# Patient Record
Sex: Female | Born: 1960 | Race: White | Hispanic: No | Marital: Married | Smoking: Never smoker
Health system: Southern US, Community
[De-identification: ages and names within clinical notes are randomized; demographics above are authoritative.]

## PROBLEM LIST (undated history)

## (undated) DIAGNOSIS — I2510 Atherosclerotic heart disease of native coronary artery without angina pectoris: Secondary | ICD-10-CM

## (undated) DIAGNOSIS — H9011 Conductive hearing loss, unilateral, right ear, with unrestricted hearing on the contralateral side: Secondary | ICD-10-CM

## (undated) DIAGNOSIS — K812 Acute cholecystitis with chronic cholecystitis: Secondary | ICD-10-CM

## (undated) DIAGNOSIS — R51 Headache: Secondary | ICD-10-CM

## (undated) DIAGNOSIS — H7201 Central perforation of tympanic membrane, right ear: Secondary | ICD-10-CM

## (undated) DIAGNOSIS — N952 Postmenopausal atrophic vaginitis: Secondary | ICD-10-CM

## (undated) DIAGNOSIS — I491 Atrial premature depolarization: Secondary | ICD-10-CM

## (undated) DIAGNOSIS — I493 Ventricular premature depolarization: Secondary | ICD-10-CM

## (undated) DIAGNOSIS — M79602 Pain in left arm: Secondary | ICD-10-CM

## (undated) DIAGNOSIS — K2960 Other gastritis without bleeding: Secondary | ICD-10-CM

## (undated) DIAGNOSIS — M542 Cervicalgia: Secondary | ICD-10-CM

## (undated) DIAGNOSIS — I499 Cardiac arrhythmia, unspecified: Secondary | ICD-10-CM

## (undated) DIAGNOSIS — J329 Chronic sinusitis, unspecified: Secondary | ICD-10-CM

## (undated) DIAGNOSIS — R918 Other nonspecific abnormal finding of lung field: Secondary | ICD-10-CM

## (undated) DIAGNOSIS — R197 Diarrhea, unspecified: Secondary | ICD-10-CM

## (undated) DIAGNOSIS — J4530 Mild persistent asthma, uncomplicated: Secondary | ICD-10-CM

## (undated) DIAGNOSIS — K811 Chronic cholecystitis: Secondary | ICD-10-CM

## (undated) DIAGNOSIS — K828 Other specified diseases of gallbladder: Secondary | ICD-10-CM

## (undated) DIAGNOSIS — H9042 Sensorineural hearing loss, unilateral, left ear, with unrestricted hearing on the contralateral side: Secondary | ICD-10-CM

## (undated) DIAGNOSIS — Z8619 Personal history of other infectious and parasitic diseases: Secondary | ICD-10-CM

## (undated) DIAGNOSIS — J4540 Moderate persistent asthma, uncomplicated: Secondary | ICD-10-CM

## (undated) DIAGNOSIS — M21619 Bunion of unspecified foot: Secondary | ICD-10-CM

## (undated) DIAGNOSIS — M47817 Spondylosis without myelopathy or radiculopathy, lumbosacral region: Secondary | ICD-10-CM

## (undated) DIAGNOSIS — T4145XA Adverse effect of unspecified anesthetic, initial encounter: Secondary | ICD-10-CM

## (undated) DIAGNOSIS — T8859XA Other complications of anesthesia, initial encounter: Secondary | ICD-10-CM

## (undated) DIAGNOSIS — E7800 Pure hypercholesterolemia, unspecified: Secondary | ICD-10-CM

## (undated) DIAGNOSIS — E785 Hyperlipidemia, unspecified: Secondary | ICD-10-CM

## (undated) DIAGNOSIS — M79609 Pain in unspecified limb: Secondary | ICD-10-CM

## (undated) DIAGNOSIS — L659 Nonscarring hair loss, unspecified: Secondary | ICD-10-CM

## (undated) HISTORY — DX: Pain in unspecified limb: M79.609

## (undated) HISTORY — DX: Chronic sinusitis, unspecified: J32.9

## (undated) HISTORY — DX: Cardiac arrhythmia, unspecified: I49.9

## (undated) HISTORY — DX: Pain in left arm: M79.602

## (undated) HISTORY — DX: Ventricular premature depolarization: I49.3

## (undated) HISTORY — DX: Other specified diseases of gallbladder: K82.8

## (undated) HISTORY — DX: Mild persistent asthma, uncomplicated: J45.30

## (undated) HISTORY — DX: Atrial premature depolarization: I49.1

## (undated) HISTORY — DX: Personal history of other infectious and parasitic diseases: Z86.19

## (undated) HISTORY — DX: Conductive hearing loss, unilateral, right ear, with unrestricted hearing on the contralateral side: H90.11

## (undated) HISTORY — DX: Bunion of unspecified foot: M21.619

## (undated) HISTORY — DX: Nonscarring hair loss, unspecified: L65.9

## (undated) HISTORY — DX: Other nonspecific abnormal finding of lung field: R91.8

## (undated) HISTORY — DX: Chronic cholecystitis: K81.1

## (undated) HISTORY — DX: Spondylosis without myelopathy or radiculopathy, lumbosacral region: M47.817

## (undated) HISTORY — PX: CHOLECYSTECTOMY: SHX55

## (undated) HISTORY — PX: INNER EAR SURGERY: SHX679

## (undated) HISTORY — DX: Other gastritis without bleeding: K29.60

## (undated) HISTORY — DX: Postmenopausal atrophic vaginitis: N95.2

## (undated) HISTORY — DX: Headache: R51

## (undated) HISTORY — DX: Hyperlipidemia, unspecified: E78.5

## (undated) HISTORY — DX: Cervicalgia: M54.2

## (undated) HISTORY — DX: Moderate persistent asthma, uncomplicated: J45.40

## (undated) HISTORY — DX: Central perforation of tympanic membrane, right ear: H72.01

## (undated) HISTORY — DX: Atherosclerotic heart disease of native coronary artery without angina pectoris: I25.10

## (undated) HISTORY — DX: Pure hypercholesterolemia, unspecified: E78.00

## (undated) HISTORY — DX: Diarrhea, unspecified: R19.7

## (undated) HISTORY — DX: Sensorineural hearing loss, unilateral, left ear, with unrestricted hearing on the contralateral side: H90.42

## (undated) HISTORY — PX: TUBAL LIGATION: SHX77

## (undated) HISTORY — DX: Acute cholecystitis with chronic cholecystitis: K81.2

---

## 1995-05-30 HISTORY — PX: SHOULDER ARTHROSCOPY: SHX128

## 1998-07-01 ENCOUNTER — Other Ambulatory Visit: Admission: RE | Admit: 1998-07-01 | Discharge: 1998-07-01 | Payer: Self-pay | Admitting: Obstetrics and Gynecology

## 2001-09-25 ENCOUNTER — Other Ambulatory Visit: Admission: RE | Admit: 2001-09-25 | Discharge: 2001-09-25 | Payer: Self-pay | Admitting: Obstetrics and Gynecology

## 2003-06-08 ENCOUNTER — Other Ambulatory Visit: Admission: RE | Admit: 2003-06-08 | Discharge: 2003-06-08 | Payer: Self-pay | Admitting: Obstetrics and Gynecology

## 2003-12-07 ENCOUNTER — Other Ambulatory Visit: Admission: RE | Admit: 2003-12-07 | Discharge: 2003-12-07 | Payer: Self-pay | Admitting: Obstetrics and Gynecology

## 2004-12-05 ENCOUNTER — Other Ambulatory Visit: Admission: RE | Admit: 2004-12-05 | Discharge: 2004-12-05 | Payer: Self-pay | Admitting: Obstetrics and Gynecology

## 2008-07-12 ENCOUNTER — Encounter: Admission: RE | Admit: 2008-07-12 | Discharge: 2008-07-12 | Payer: Self-pay | Admitting: Family Medicine

## 2009-08-06 ENCOUNTER — Encounter: Admission: RE | Admit: 2009-08-06 | Discharge: 2009-08-06 | Payer: Self-pay | Admitting: Family Medicine

## 2009-08-18 ENCOUNTER — Encounter: Admission: RE | Admit: 2009-08-18 | Discharge: 2009-08-18 | Payer: Self-pay | Admitting: Pediatrics

## 2011-01-28 HISTORY — PX: WRIST ARTHROPLASTY: SHX1088

## 2011-04-27 ENCOUNTER — Other Ambulatory Visit: Payer: Self-pay | Admitting: Orthopedic Surgery

## 2011-05-02 ENCOUNTER — Encounter (HOSPITAL_BASED_OUTPATIENT_CLINIC_OR_DEPARTMENT_OTHER): Payer: Self-pay | Admitting: *Deleted

## 2011-05-02 NOTE — Progress Notes (Signed)
Not htn,asthma controlled No labs needed

## 2011-05-04 ENCOUNTER — Encounter (HOSPITAL_BASED_OUTPATIENT_CLINIC_OR_DEPARTMENT_OTHER): Payer: Self-pay | Admitting: Orthopedic Surgery

## 2011-05-04 ENCOUNTER — Ambulatory Visit (HOSPITAL_BASED_OUTPATIENT_CLINIC_OR_DEPARTMENT_OTHER)
Admission: RE | Admit: 2011-05-04 | Discharge: 2011-05-04 | Disposition: A | Discharge status: Home / Self Care | Payer: BC Managed Care – PPO | Source: Ambulatory Visit | Attending: Orthopedic Surgery | Admitting: Orthopedic Surgery

## 2011-05-04 ENCOUNTER — Encounter (HOSPITAL_BASED_OUTPATIENT_CLINIC_OR_DEPARTMENT_OTHER)
Admission: RE | Disposition: A | Discharge status: Home / Self Care | Payer: Self-pay | Source: Ambulatory Visit | Attending: Orthopedic Surgery

## 2011-05-04 ENCOUNTER — Encounter (HOSPITAL_BASED_OUTPATIENT_CLINIC_OR_DEPARTMENT_OTHER): Payer: Self-pay | Admitting: Anesthesiology

## 2011-05-04 ENCOUNTER — Ambulatory Visit (HOSPITAL_BASED_OUTPATIENT_CLINIC_OR_DEPARTMENT_OTHER): Payer: BC Managed Care – PPO | Admitting: Anesthesiology

## 2011-05-04 DIAGNOSIS — Z472 Encounter for removal of internal fixation device: Secondary | ICD-10-CM | POA: Insufficient documentation

## 2011-05-04 HISTORY — DX: Other complications of anesthesia, initial encounter: T88.59XA

## 2011-05-04 HISTORY — PX: HARDWARE REMOVAL: SHX979

## 2011-05-04 HISTORY — DX: Adverse effect of unspecified anesthetic, initial encounter: T41.45XA

## 2011-05-04 SURGERY — REMOVAL, HARDWARE
Anesthesia: General | Site: Wrist | Laterality: Left | Wound class: Clean

## 2011-05-04 MED ORDER — ONDANSETRON HCL 4 MG/2ML IJ SOLN
INTRAMUSCULAR | Status: DC | PRN
  Administered 2011-05-04: 4 mg via INTRAVENOUS

## 2011-05-04 MED ORDER — FENTANYL CITRATE 0.05 MG/ML IJ SOLN
INTRAMUSCULAR | Status: DC | PRN
  Administered 2011-05-04: 100 ug via INTRAVENOUS

## 2011-05-04 MED ORDER — IBUPROFEN 600 MG PO TABS: 600.0000 mg | ORAL_TABLET | Freq: Four times a day (QID) | ORAL | Status: AC | PRN

## 2011-05-04 MED ORDER — PROMETHAZINE HCL 25 MG/ML IJ SOLN: 6.2500 mg | INTRAMUSCULAR | Status: DC | PRN

## 2011-05-04 MED ORDER — MEPERIDINE HCL 25 MG/ML IJ SOLN: 6.2500 mg | INTRAMUSCULAR | Status: DC | PRN

## 2011-05-04 MED ORDER — HYDROMORPHONE HCL PF 1 MG/ML IJ SOLN: 0.2500 mg | INTRAMUSCULAR | Status: DC | PRN

## 2011-05-04 MED ORDER — LIDOCAINE HCL 2 % IJ SOLN
INTRAMUSCULAR | Status: DC | PRN
  Administered 2011-05-04: 5 mL

## 2011-05-04 MED ORDER — PROPOFOL 10 MG/ML IV EMUL
INTRAVENOUS | Status: DC | PRN
  Administered 2011-05-04: 150 mg via INTRAVENOUS

## 2011-05-04 MED ORDER — CEPHALEXIN 500 MG PO CAPS: 500.0000 mg | ORAL_CAPSULE | Freq: Three times a day (TID) | ORAL | Status: AC

## 2011-05-04 MED ORDER — MIDAZOLAM HCL 5 MG/5ML IJ SOLN
INTRAMUSCULAR | Status: DC | PRN
  Administered 2011-05-04: 2 mg via INTRAVENOUS

## 2011-05-04 MED ORDER — FENTANYL CITRATE 0.05 MG/ML IJ SOLN
25.0000 ug | INTRAMUSCULAR | Status: DC | PRN
  Administered 2011-05-04: 50 ug via INTRAVENOUS

## 2011-05-04 MED ORDER — HYDROMORPHONE HCL 2 MG PO TABS: ORAL_TABLET | ORAL | Status: AC

## 2011-05-04 MED ORDER — CEFAZOLIN SODIUM 1-5 GM-% IV SOLN
1.0000 g | Freq: Once | INTRAVENOUS | Status: AC
  Administered 2011-05-04: 1 g via INTRAVENOUS

## 2011-05-04 MED ORDER — LACTATED RINGERS IV SOLN
INTRAVENOUS | Status: DC
  Administered 2011-05-04: 09:00:00 via INTRAVENOUS

## 2011-05-04 MED ORDER — CHLORHEXIDINE GLUCONATE 4 % EX LIQD: 60.0000 mL | Freq: Once | CUTANEOUS | Status: DC

## 2011-05-04 MED ORDER — DEXAMETHASONE SODIUM PHOSPHATE 10 MG/ML IJ SOLN
INTRAMUSCULAR | Status: DC | PRN
  Administered 2011-05-04: 10 mg via INTRAVENOUS

## 2011-05-04 SURGICAL SUPPLY — 50 items
BANDAGE ADHESIVE 1X3 (GAUZE/BANDAGES/DRESSINGS) IMPLANT
BANDAGE ELASTIC 3 VELCRO ST LF (GAUZE/BANDAGES/DRESSINGS) ×2 IMPLANT
BANDAGE GAUZE ELAST BULKY 4 IN (GAUZE/BANDAGES/DRESSINGS) ×1 IMPLANT
BLADE MINI RND TIP GREEN BEAV (BLADE) IMPLANT
BLADE SURG 15 STRL LF DISP TIS (BLADE) ×1 IMPLANT
BLADE SURG 15 STRL SS (BLADE) ×2
BNDG CMPR 9X4 STRL LF SNTH (GAUZE/BANDAGES/DRESSINGS) ×1
BNDG ESMARK 4X9 LF (GAUZE/BANDAGES/DRESSINGS) ×2 IMPLANT
BRUSH SCRUB EZ PLAIN DRY (MISCELLANEOUS) ×2 IMPLANT
CLOTH BEACON ORANGE TIMEOUT ST (SAFETY) ×2 IMPLANT
CORDS BIPOLAR (ELECTRODE) ×2 IMPLANT
COVER MAYO STAND STRL (DRAPES) ×2 IMPLANT
COVER TABLE BACK 60X90 (DRAPES) ×2 IMPLANT
CUFF TOURNIQUET SINGLE 18IN (TOURNIQUET CUFF) ×1 IMPLANT
DECANTER SPIKE VIAL GLASS SM (MISCELLANEOUS) ×1 IMPLANT
DRAPE EXTREMITY T 121X128X90 (DRAPE) ×2 IMPLANT
DRAPE OEC MINIVIEW 54X84 (DRAPES) IMPLANT
DRAPE SURG 17X23 STRL (DRAPES) ×2 IMPLANT
GAUZE XEROFORM 1X8 LF (GAUZE/BANDAGES/DRESSINGS) IMPLANT
GLOVE BIO SURGEON STRL SZ 6.5 (GLOVE) ×1 IMPLANT
GLOVE BIOGEL M STRL SZ7.5 (GLOVE) ×2 IMPLANT
GLOVE BIOGEL PI IND STRL 7.0 (GLOVE) IMPLANT
GLOVE BIOGEL PI INDICATOR 7.0 (GLOVE) ×1
GLOVE ORTHO TXT STRL SZ7.5 (GLOVE) ×2 IMPLANT
GOWN BRE IMP PREV XXLGXLNG (GOWN DISPOSABLE) ×2 IMPLANT
GOWN PREVENTION PLUS XLARGE (GOWN DISPOSABLE) ×2 IMPLANT
GOWN PREVENTION PLUS XXLARGE (GOWN DISPOSABLE) ×2 IMPLANT
NEEDLE 27GAX1X1/2 (NEEDLE) ×1 IMPLANT
NS IRRIG 1000ML POUR BTL (IV SOLUTION) ×2 IMPLANT
PACK BASIN DAY SURGERY FS (CUSTOM PROCEDURE TRAY) ×2 IMPLANT
PAD CAST 3X4 CTTN HI CHSV (CAST SUPPLIES) IMPLANT
PADDING CAST ABS 4INX4YD NS (CAST SUPPLIES)
PADDING CAST ABS COTTON 4X4 ST (CAST SUPPLIES) ×1 IMPLANT
PADDING CAST COTTON 3X4 STRL (CAST SUPPLIES) ×2
SLEEVE SCD COMPRESS KNEE MED (MISCELLANEOUS) ×1 IMPLANT
SPLINT PLASTER CAST XFAST 3X15 (CAST SUPPLIES) IMPLANT
SPLINT PLASTER XTRA FASTSET 3X (CAST SUPPLIES) ×5
SPONGE GAUZE 4X4 12PLY (GAUZE/BANDAGES/DRESSINGS) ×1 IMPLANT
STOCKINETTE 4X48 STRL (DRAPES) ×2 IMPLANT
STRIP CLOSURE SKIN 1/2X4 (GAUZE/BANDAGES/DRESSINGS) ×2 IMPLANT
SUT PROLENE 3 0 PS 2 (SUTURE) ×2 IMPLANT
SUT VIC AB 3-0 PS1 18 (SUTURE) ×2
SUT VIC AB 3-0 PS1 18XBRD (SUTURE) ×1 IMPLANT
SYR 3ML 23GX1 SAFETY (SYRINGE) IMPLANT
SYR BULB 3OZ (MISCELLANEOUS) ×2 IMPLANT
SYR CONTROL 10ML LL (SYRINGE) ×1 IMPLANT
TOWEL OR 17X24 6PK STRL BLUE (TOWEL DISPOSABLE) ×2 IMPLANT
TRAY DSU PREP LF (CUSTOM PROCEDURE TRAY) ×2 IMPLANT
UNDERPAD 30X30 INCONTINENT (UNDERPADS AND DIAPERS) ×2 IMPLANT
WATER STERILE IRR 1000ML POUR (IV SOLUTION) ×2 IMPLANT

## 2011-05-04 NOTE — Interval H&P Note (Signed)
History and Physical Interval Note:  05/04/2011 8:30 AM  Jessica Francis  has presented today for surgery, with the diagnosis of status post orif left radius and ulna fractures  The various methods of treatment have been discussed with the patient and family. After consideration of risks, benefits and other options for treatment, the patient has consented to  Procedure(s): HARDWARE REMOVAL as a surgical intervention .  The patients' history has been reviewed, patient examined, no change in status, stable for surgery.  I have reviewed the patients' chart and labs.  Questions were answered to the patient's satisfaction.     Jessica Francis,Jessica Francis  H&P documentation:05/04/11  -History and Physical Reviewed  -Patient has been re-examined  -No change in the plan of care  Jessica Forster, MD

## 2011-05-04 NOTE — Anesthesia Preprocedure Evaluation (Signed)
Anesthesia Evaluation  Patient identified by MRN, date of birth, ID band Patient awake    Reviewed: Allergy & Precautions, H&P , NPO status , Patient's Chart, lab work & pertinent test results  Airway Mallampati: I TM Distance: >3 FB Neck ROM: full    Dental No notable dental hx. (+) Teeth Intact   Pulmonary asthma ,  clear to auscultation  Pulmonary exam normal       Cardiovascular neg cardio ROS regular Normal    Neuro/Psych Negative Neurological ROS  Negative Psych ROS   GI/Hepatic negative GI ROS, Neg liver ROS,   Endo/Other  Negative Endocrine ROS  Renal/GU negative Renal ROS  Genitourinary negative   Musculoskeletal   Abdominal   Peds  Hematology negative hematology ROS (+)   Anesthesia Other Findings   Reproductive/Obstetrics negative OB ROS                           Anesthesia Physical Anesthesia Plan  ASA: II  Anesthesia Plan: General   Post-op Pain Management:    Induction: Intravenous  Airway Management Planned: LMA  Additional Equipment:   Intra-op Plan:   Post-operative Plan: Extubation in OR  Informed Consent: I have reviewed the patients History and Physical, chart, labs and discussed the procedure including the risks, benefits and alternatives for the proposed anesthesia with the patient or authorized representative who has indicated his/her understanding and acceptance.     Plan Discussed with: CRNA  Anesthesia Plan Comments:         Anesthesia Quick Evaluation

## 2011-05-04 NOTE — H&P (Signed)
PATIENT: Jessica Francis      SEEN BY:   Katy Fitch. Naaman Plummer., M.D.    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 On 02-05-11 she fell sustaining a very displaced fracture of her distal radius and ulna on the left. This was an open fracture. She was seen by Dr. Cathrine Muster of [facility] and underwent ORIF with I&D of her fractures on 02-05-11. Dr. Cathrine Muster placed a DVR type plate volar with threaded pegs and screws and used a K-wire to reduce the ulna. The reduction of the ulna was near anatomic, the radius was also near anatomic. She has gone on to heal her wounds without complications. She is in a fiberglass cast that is currently tight. She has done an excellent job of maintaining ROM.   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 Her pin is beginning to show osteolysis in the ulna. She has pegs that are proud dorsally and the contour of her volar plate is above the distal lip of the radius placing her flexor tendons at some risk. She appears to be clinically united to the point where immobilization can be discontinued at this time. I provided detailed informed consent regarding her plate. I have recommended that she consider plate removal at 3 months post injury to prevent extensor or flexor tendon ruptures. At least 2 pegs are transcortical dorsally.   After alcohol prep, (we did not use iodine or Betadine due to her allergy) her pin was removed from the ulna without significant difficulty. She reported quite a bit of pain radiating proximally raising the question whether or not the pin was contacting her dorsal ulnar sensory branch. She was dressed with a Band-aid. She was placed in a wrist forearm Velcro splint. We will encourage warm water soaks and AROM exercises. I will see her back for follow up in 2 weeks at which time we will  begin a structured therapy program. We will tentatively plan on removing her pin from the wrist in December of 2012. She understands she is at some risk for rupture of her tendons as a consequence of hardware prominence.  RVS/phe T: 03-17-11  OFFICE VISIT:      11.2.12             SEEN BY:   Katy Fitch. Naaman Plummer., M.D.    Jessica Francis returns to review the progress of her left distal radius and ulna fractures.  Her pins were removed from the ulna on 03/15/11.  She is working on range of motion.  She can supinate 40 degrees and pronate 30 degrees. She has palmar flexion of 30, dorsiflexion to neutral.  She perceives pain at the distal radial ulnar joint.    Current x-rays reveal healing of her fracture.   PLAN:  We will discontinue her splint. Due to the technical issues with her plate placement and screw length I am recommending that we remove her plate and screws in December. 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  I have encouraged her to begin warm water soaks and aggressive pronation/supination exercises as well as finger and thumb range of motion exercises.  I will see her back for follow-up in two weeks.  At that time we will schedule removal of her plate, screws and possible partial capsulectomy of  the distal radial ulnar joint to facilitate range of motion.   Questions were invited and answered in detail.   Katy Fitch Sypher, M.D., Jr./cmf   T:  11.5.12   PATIENT:  Jessica Francis      SEEN BY:   Katy Fitch. Naaman Plummer., M.D.  OFFICE VISIT:      11.16.12                   DOB:   [birth year]  Jessica Francis returns for follow-up examination of her left distal forearm double bone forearm fracture with intraarticular fractures of the ulnar head, sigmoid notch and distal radius.  She has a malposition DVR plate that is prominent volarly and has long pegs and screws placing her extensors at risk.  She has pain at the distal radial ulnar joint due to a stepoff and sigmoid notch  articulation. Her radius and ulna have healed.  Her stepoff and osteophyte issues are well documented by x-rays today.    We have advised her to schedule plate removal to prevent injury to her extensor tendons and will perform an arthrotomy at the distal radial ulnar joint and cheilectomy to try to correct her crepitation of the distal radial ulnar joint.  The procedure and aftercare were described in detail.  We will schedule this at a mutually convenient time in December.  Her first choice is December 6th.  Katy Fitch Sypher, M.D., Jr./cmf   T:  11.19.12

## 2011-05-04 NOTE — H&P (Addendum)
is an 50 y.o. female.   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 On 02-05-11 she fell sustaining a very displaced fracture of her distal radius and ulna on the left. This was an open fracture. She was seen by Dr. Cathrine Muster of [facility] and underwent ORIF with I&D of her fractures on 02-05-11. Dr. Cathrine Muster placed a DVR type plate volar with threaded pegs and screws and used a K-wire to reduce the ulna. The reduction of the ulna was near anatomic, the radius was also near anatomic. She has gone on to heal her wounds without complications  Past Medical History  Diagnosis Date  . Complication of anesthesia     hard to wake up  . Asthma     Past Surgical History  Procedure Date  . Tubal ligation   . Shoulder arthroscopy 1997    lt  . Wrist arthroplasty 9/12    lt -[facility]    History reviewed. No pertinent family history. Social History:  reports that she has never smoked. She does not have any smokeless tobacco history on file. She reports that she does not drink alcohol or use illicit drugs.  Allergies:  Allergies  Allergen Reactions  . Contrast Media (Iodinated Diagnostic Agents) Hives  . Doxycycline     dizzy  . Iodine Hives  . Iohexol     No current facility-administered medications on file as of .   Medications Prior to Admission  Medication Sig Dispense Refill  . calcium carbonate (OS-CAL - DOSED IN MG OF ELEMENTAL CALCIUM) 1250 MG tablet Take 1 tablet by mouth daily.        . cholecalciferol (VITAMIN D) 1000 UNITS tablet Take 1,000 Units by mouth daily.        Marland Kitchen estradiol-norethindrone (ACTIVELLA) 1-0.5 MG per tablet Take 1 tablet by mouth daily.        . fluticasone (FLONASE) 50 MCG/ACT nasal spray Place 2 sprays into the nose daily.        . fluticasone (FLOVENT HFA) 110 MCG/ACT inhaler Inhale 1 puff into the lungs 2 (two) times daily.         . Multiple Vitamin (MULTIVITAMIN) capsule Take 1 capsule by mouth daily.        . pravastatin (PRAVACHOL) 20 MG tablet Take 20 mg by mouth every evening.          No results found for this or any previous visit (from the past 48 hour(s)).  No results found.   Pertinent items are noted in HPI.  Height 5\' 3"  (1.6 m), weight 65.772 kg (145 lb).  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 Her pin is beginning to show osteolysis in the ulna. She has pegs that are proud dorsally and the contour of her volar plate is above the distal lip of the radius placing her flexor tendons at some risk. She appears to be clinically united to the point where immobilization can be discontinued at this time.  Pulses: 2+ and symmetric Skin: normal Neurologic: Grossly normal    Assessment/Plan S/P ORIF left distal radius fracture  Plan:  To OR for plate removal with arthrotomy of distal DRUJ and resection of  ulna head left wrist.  DASNOIT,Beverlee Wilmarth J 05/04/2011, 7:24 AM  H&P documentation:05/04/11  -History and Physical Reviewed  -Patient has been re-examined  -No change in the plan of care  Wyn Forster, MD         Wyn Forster, MD

## 2011-05-04 NOTE — Anesthesia Postprocedure Evaluation (Signed)
  Anesthesia Post-op Note  Patient: Jessica Francis  Procedure(s) Performed:  HARDWARE REMOVAL - DVR plate removal left wrist,  Patient Location: PACU  Anesthesia Type: General  Level of Consciousness: awake and sedated  Airway and Oxygen Therapy: Patient Spontanous Breathing  Post-op Pain: none  Post-op Assessment: Post-op Vital signs reviewed, Patient's Cardiovascular Status Stable, Respiratory Function Stable, Patent Airway and No signs of Nausea or vomiting  Post-op Vital Signs: Reviewed and stable  Complications: No apparent anesthesia complications

## 2011-05-04 NOTE — Transfer of Care (Signed)
Immediate Anesthesia Transfer of Care Note  Patient: Jessica Francis  Procedure(s) Performed:  HARDWARE REMOVAL - DVR plate removal left wrist,  Patient Location: PACU  Anesthesia Type: General  Level of Consciousness: awake, alert  and sedated  Airway & Oxygen Therapy: Patient Spontanous Breathing and Patient connected to face mask oxygen  Post-op Assessment: Report given to PACU RN and Post -op Vital signs reviewed and stable  Post vital signs: Reviewed and stable  Complications: No apparent anesthesia complications

## 2011-05-04 NOTE — Anesthesia Postprocedure Evaluation (Signed)
  Anesthesia Post-op Note  Patient: Jessica Francis  Procedure(s) Performed:  HARDWARE REMOVAL - DVR plate removal left wrist,  Patient Location: PACU  Anesthesia Type: General  Level of Consciousness: sedated  Airway and Oxygen Therapy: Patient Spontanous Breathing and Patient connected to face mask oxygen  Post-op Pain: none  Post-op Assessment: Post-op Vital signs reviewed, Patient's Cardiovascular Status Stable, Respiratory Function Stable and Patent Airway  Post-op Vital Signs: Reviewed and stable  Complications: No apparent anesthesia complications

## 2011-05-04 NOTE — Op Note (Signed)
Op note dictated:  [id number] 05/04/11

## 2011-05-04 NOTE — Brief Op Note (Signed)
05/04/2011  9:56 AM  PATIENT:  Jessica Francis  50 y.o. female  PRE-OPERATIVE DIAGNOSIS:  status post orif left radius and ulna fractures with hardware complication  POST-OPERATIVE DIAGNOSIS:  status post orif left radius and ulna fractures with hardware complication  PROCEDURE:  Procedure(s): HARDWARE REMOVAL DVR PLATE AND 7 SCREWS  SURGEON:  Surgeon(s): Wyn Forster., MD  PHYSICIAN ASSISTANT:   ASSISTANTS: Katrell Milhorn Dasnoit,P.A.-C  ANESTHESIA:   general  EBL:  Total I/O In: 400 [I.V.:400] Out: -   BLOOD ADMINISTERED:none  DRAINS: none   LOCAL MEDICATIONS USED:  XYLOCAINE 4 CC  SPECIMEN:  No Specimen  DISPOSITION OF SPECIMEN:  N/A  COUNTS:  YES  TOURNIQUET:  * Missing tourniquet times found for documented tourniquets in log:  10422 *  DICTATION: .Other Dictation: Dictation Number [id number]  PLAN OF CARE: Discharge to home after PACU  PATIENT DISPOSITION:  PACU - hemodynamically stable.

## 2011-05-04 NOTE — Anesthesia Preprocedure Evaluation (Deleted)
Anesthesia Evaluation Anesthesia Physical Anesthesia Plan Anesthesia Quick Evaluation  

## 2011-05-04 NOTE — Op Note (Signed)
NAMESHATIRA, DOBOSZ                 ACCOUNT NO.:  [id number]  MEDICAL RECORD NO.:  [id number]  LOCATION:                                 FACILITY:  PHYSICIAN:  Katy Fitch. Jessica Francis, M.D.      DATE OF BIRTH:  DATE OF PROCEDURE:  05/04/2011 DATE OF DISCHARGE:                              OPERATIVE REPORT   PREOPERATIVE DIAGNOSIS:  Status post open reduction and internal fixation of left radius and ulna following an open fracture with a volar cutaneous presentation of the fracture fragments status post irrigation, debridement, and open reduction and internal fixation performed in [place], [place] by Dr. Lenise Arena, status post removal of hardware fixation from the ulna and now with hardware complication of radius requiring removal of DVR plate and 7 screws.  POSTOPERATIVE DIAGNOSIS:  Status post open reduction and internal fixation of left radius and ulna following an open fracture with a volar cutaneous presentation of the fracture fragments status post irrigation, debridement, and open reduction and internal fixation performed in [place], [place] by Dr. Lenise Arena, status post removal of hardware fixation from the ulna and now with hardware complication of radius requiring removal of DVR plate and 7 screws.  OPERATION:  Removal of left radius DVR plate and 7 screws.  OPERATING SURGEON:  Katy Fitch. Arine Foley, MD  ASSISTANT:  Annye Rusk, PA-C  ANESTHESIA:  General by LMA.  SUPERVISING ANESTHESIOLOGIST:  Zenon Mayo, MD  INDICATIONS:  Cleotilde Spadaccini is a 50 year old woman who sustained a fall 3 months prior sustaining a severe type 3 open fracture of her radius and ulna.  She was seen at [facility] and treated by Dr. Lenise Arena.  Dr. Lenise Arena recommended irrigation and debridement of her fracture followed by open reduction and internal fixation of her radius and ulna.  The radius was fixed with a DVR plate and 7 screws.  The ulna was treated with open reduction, and  Kirschner wire fixation.  Dr. Lenise Arena elected to retire and subsequently transferred Mrs. Ketner for followup care in [place]. Upon presentation, it was apparent that her ulna was healing satisfactorily, and her Kirschner wires beginning to loosen.  Her Kirschner wires were removed without difficulty in the office.  She has gone on to heal both fractures but was noted to have prominence of the plate radially near the radial styloid and with screws that were presenting in the fourth dorsal compartment and third dorsal compartment region dorsally.  These were threaded screws rather than pegs, therefore, we recommended removal of her plate at this time to prevent possible tendon complications.  Preoperatively, she was reminded of the potential risks and benefits of surgery.  Initially, she had marked impairment of motion in distal radioulnar joint and had a bone spur at the ulnar head.  We discussed possible ulnar head revision, however, as her motion has improved and her ulnar head was remodeled at this point, she has adequate pronation and supination, therefore, I elected not to proceed with ulnar head surgery.  She was advised to proceed with plate removal at this time.  Questions were invited and answered in detail.  PROCEDURE:  Hae Ahlers was brought to room 1  of the [facility], and placed in supine position upon the operating table. Following informed consent by Dr. Sampson Goon, general anesthesia by LMA technique was recommended and accepted.  Under Dr. Jarrett Ables direct supervision in room 1, general anesthesia by LMA technique was induced, followed by routine preparation of the left arm with chlorhexidine due to an iodine allergy.  Sterile stockinette and impervious arthroscopy drapes were applied, followed by a routine surgical time-out.  The left arm was then exsanguinated with an Esmarch bandage, and an arterial tourniquet on the proximal brachium inflated to  220 mmHg.  Procedure commenced with resection of the prior surgical scar.  The subcutaneous tissues were carefully divided taking care to identify and gently retract the radial, superficial, and  sensory branches in the radial artery.  The scar overlying the flexor carpi radialis was dissected with scissors, followed by identification of the flexor pollicis longus.  The flexor pollicis longus was elevated with careful dissection of the scar until the plate was visible.  A periosteal elevator was used to clear the plate of soft tissues taking care to elevate the flexor pollicis longus with the scar envelope.  There was noted to be stripping of the distal radial screw with a 3-mm prominence. This did place the flexor pollicis longus at significant risk for rupture.  With great care, the threaded screws were removed from the distal aspect of the plate with a 2-mm screwdriver, followed by removal of 2 screws from the shaft portion of the plate.  With gentle periosteal stripping with a Therapist, nutritional, the plate was removed.  Areas of soft tissue that had grown into the plate were removed with a rongeur, and areas of titanium staining on the volar aspect of the metaphysis of the radius were cleared with a rongeur.  The wound was thoroughly irrigated, followed by repair of the skin with subcutaneous sutures of 3-0 Vicryl. The pronator quadratus was not repairable.  The skin was repaired with intradermal 3-0 Prolene and Steri-Strips.  For aftercare, Ms. Damas was placed in a compressive dressing with a volar plaster splint.  She will be discharged home with prescriptions for ibuprofen 600 mg 1 p.o. q.6 h. p.r.n. pain, 30 tablets with 1 refill, Dilaudid 2 mg 1-2 tablets p.o. q.4-6 h. p.r.n. pain, 30 tablets without refill, and Keflex 500 mg 1 p.o. q.8 hours x3 days as a prophylactic antibiotic.  She was provided 1 g of Ancef as an IV prophylactic antibiotic preoperatively.  Questions were  invited and answered.     Katy Fitch Jalaysia Lobb, M.D.     RVS/MEDQ  D:  05/04/2011  T:  05/04/2011  Job:  [id number]

## 2011-05-04 NOTE — Anesthesia Procedure Notes (Addendum)
Procedure Name: LMA Insertion Date/Time: 05/04/2011 9:33 AM Performed by: Gladys Damme Pre-anesthesia Checklist: Patient identified, Timeout performed, Emergency Drugs available, Suction available and Patient being monitored Patient Re-evaluated:Patient Re-evaluated prior to inductionOxygen Delivery Method: Circle System Utilized Preoxygenation: Pre-oxygenation with 100% oxygen Intubation Type: IV induction Ventilation: Mask ventilation without difficulty LMA: LMA inserted LMA Size: 4.0 Number of attempts: 1 Placement Confirmation: breath sounds checked- equal and bilateral and positive ETCO2 Tube secured with: Tape Dental Injury: Teeth and Oropharynx as per pre-operative assessment

## 2011-05-05 ENCOUNTER — Encounter (HOSPITAL_BASED_OUTPATIENT_CLINIC_OR_DEPARTMENT_OTHER): Payer: Self-pay | Admitting: Orthopedic Surgery

## 2013-03-14 ENCOUNTER — Ambulatory Visit (INDEPENDENT_AMBULATORY_CARE_PROVIDER_SITE_OTHER): Payer: BC Managed Care – PPO | Admitting: Cardiology

## 2013-03-14 ENCOUNTER — Encounter: Payer: Self-pay | Admitting: Cardiology

## 2013-03-14 VITALS — BP 134/81 | HR 71 | Ht 62.5 in | Wt 146.8 lb

## 2013-03-14 DIAGNOSIS — R9389 Abnormal findings on diagnostic imaging of other specified body structures: Secondary | ICD-10-CM

## 2013-03-14 DIAGNOSIS — I2510 Atherosclerotic heart disease of native coronary artery without angina pectoris: Secondary | ICD-10-CM

## 2013-03-14 DIAGNOSIS — R931 Abnormal findings on diagnostic imaging of heart and coronary circulation: Secondary | ICD-10-CM

## 2013-03-14 NOTE — Patient Instructions (Signed)
The current medical regimen is effective;  continue present plan and medications.  Your physician has requested that you have an exercise tolerance test. For further information please visit [email or web address]. Please also follow instruction sheet, as given.  Follow up in 2 years with Dr Hochrein.  You will receive a letter in the mail 2 months before you are due.  Please call us when you receive this letter to schedule your follow up appointment.   

## 2013-03-14 NOTE — Progress Notes (Signed)
HPI The patient presents for evaluation of an abnormal CT. She has no history of coronary artery disease. However, she has a very strong family history of early onset heart disease. She was recently incidentally found to have coronary calcium on a chest CT that was done to will valuate small nodules and scarring. She denies any cardiovascular symptoms. She walks routinely with her husband and her dog. The patient denies any new symptoms such as chest discomfort, neck or arm discomfort. There has been no new shortness of breath, PND or orthopnea. There have been no reported palpitations, presyncope or syncope.  Allergies  Allergen Reactions  . Contrast Media [Iodinated Diagnostic Agents] Hives  . Doxycycline     dizzy  . Iodine Hives  . Iohexol     Current Outpatient Prescriptions  Medication Sig Dispense Refill  . albuterol (PROVENTIL HFA;VENTOLIN HFA) 108 (90 BASE) MCG/ACT inhaler Inhale 2 puffs into the lungs every 6 (six) hours as needed for wheezing.      Marland Kitchen aspirin 81 MG tablet Take 81 mg by mouth daily.      . beclomethasone (QVAR) 80 MCG/ACT inhaler Inhale 1 puff into the lungs as needed.      . calcium carbonate (OS-CAL - DOSED IN MG OF ELEMENTAL CALCIUM) 1250 MG tablet Take 1 tablet by mouth daily.        . cholecalciferol (VITAMIN D) 1000 UNITS tablet Take 1,000 Units by mouth daily.        Marland Kitchen estradiol-norethindrone (ACTIVELLA) 1-0.5 MG per tablet Take 1 tablet by mouth daily.        . fluticasone (FLONASE) 50 MCG/ACT nasal spray Place 2 sprays into the nose daily.        . Magnesium 500 MG CAPS Take 500 mg by mouth daily.       . Multiple Vitamin (MULTIVITAMIN) capsule Take 1 capsule by mouth daily.        . pravastatin (PRAVACHOL) 40 MG tablet Take 40 mg by mouth daily.       No current facility-administered medications for this visit.    Past Medical History  Diagnosis Date  . Complication of anesthesia     hard to wake up  . Asthma   . Unspecified sinusitis (chronic)    . Headache(784.0)   . Postmenopausal atrophic vaginitis   . Cardiac dysrhythmia, unspecified   . Cervicalgia   . Alopecia   . Pain in limb   . Bunion   . Pure hypercholesterolemia     Past Surgical History  Procedure Laterality Date  . Tubal ligation    . Shoulder arthroscopy  1997    lt  . Wrist arthroplasty  9/12    lt -[facility]  . Hardware removal  05/04/2011    Procedure: HARDWARE REMOVAL;  Surgeon: Wyn Forster., MD;  Location: [facility];  Service: Orthopedics;  Laterality: Left;  DVR plate removal left wrist,    No family history on file.  History   Social History  . Marital Status: Married    Spouse Name: N/A    Number of Children: N/A  . Years of Education: N/A   Occupational History  . Not on file.   Social History Main Topics  . Smoking status: Never Smoker   . Smokeless tobacco: Not on file  . Alcohol Use: No  . Drug Use: No  . Sexual Activity: Not on file   Other Topics Concern  . Not on file   Social  History Narrative  . No narrative on file    ROS:   Positive for joint pains , asthama.  Otherwise as stated in the HPI and negative for all other systems.  PHYSICAL EXAM BP 134/81  Pulse 71  Ht 5' 2.5" (1.588 m)  Wt 146 lb 12.8 oz (66.588 kg)  BMI 26.41 kg/m2 GENERAL:  Well appearing HEENT:  Pupils equal round and reactive, fundi not visualized, oral mucosa unremarkable NECK:  No jugular venous distention, waveform within normal limits, carotid upstroke brisk and symmetric, no bruits, no thyromegaly LYMPHATICS:  No cervical, inguinal adenopathy LUNGS:  Clear to auscultation bilaterally BACK:  No CVA tenderness CHEST:  Unremarkable HEART:  PMI not displaced or sustained,S1 and S2 within normal limits, no S3, no S4, no clicks, no rubs, no murmurs ABD:  Flat, positive bowel sounds normal in frequency in pitch, no bruits, no rebound, no guarding, no midline pulsatile mass, no hepatomegaly, no splenomegaly EXT:  2  plus pulses throughout, no edema, no cyanosis no clubbing SKIN:  No rashes no nodules NEURO:  Cranial nerves II through XII grossly intact, motor grossly intact throughout PSYCH:  Cognitively intact, oriented to person place and time  EKG:  Sinus rhythm, rate 71, axis within normal limits, intervals within normal limits, no acute ST-T wave changes.  03/14/2013   ASSESSMENT AND PLAN  CORONARY CALCIUM:  I will bring the patient back for a POET (Plain Old Exercise Test). This will allow me to screen for obstructive coronary disease, risk stratify and very importantly provide a prescription for exercise.  I reviewed the CT scan.  DYSLIPIDEMIA:   I would agree with aggressive risk reduction that her 10 year risk of this is probably greater than 7.5% given the family history.  I will defer to Dr. Nathanial Rancher.  We had a long discussion about primary prevention.

## 2013-03-15 DIAGNOSIS — I2510 Atherosclerotic heart disease of native coronary artery without angina pectoris: Secondary | ICD-10-CM

## 2013-03-15 HISTORY — DX: Atherosclerotic heart disease of native coronary artery without angina pectoris: I25.10

## 2013-04-07 ENCOUNTER — Ambulatory Visit (INDEPENDENT_AMBULATORY_CARE_PROVIDER_SITE_OTHER): Payer: BC Managed Care – PPO | Admitting: Critical Care Medicine

## 2013-04-07 ENCOUNTER — Encounter: Payer: Self-pay | Admitting: Critical Care Medicine

## 2013-04-07 VITALS — BP 124/82 | HR 82 | Temp 98.5°F | Ht 63.0 in | Wt 145.0 lb

## 2013-04-07 DIAGNOSIS — R918 Other nonspecific abnormal finding of lung field: Secondary | ICD-10-CM | POA: Insufficient documentation

## 2013-04-07 DIAGNOSIS — E785 Hyperlipidemia, unspecified: Secondary | ICD-10-CM

## 2013-04-07 DIAGNOSIS — R911 Solitary pulmonary nodule: Secondary | ICD-10-CM

## 2013-04-07 DIAGNOSIS — J45909 Unspecified asthma, uncomplicated: Secondary | ICD-10-CM

## 2013-04-07 DIAGNOSIS — J4540 Moderate persistent asthma, uncomplicated: Secondary | ICD-10-CM

## 2013-04-07 HISTORY — DX: Other nonspecific abnormal finding of lung field: R91.8

## 2013-04-07 HISTORY — DX: Moderate persistent asthma, uncomplicated: J45.40

## 2013-04-07 HISTORY — DX: Hyperlipidemia, unspecified: E78.5

## 2013-04-07 NOTE — Assessment & Plan Note (Signed)
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

## 2013-04-07 NOTE — Patient Instructions (Signed)
We will have 2009 CT Chest compared with 2014 CT Chest. I will call the results Return as needed

## 2013-04-07 NOTE — Progress Notes (Signed)
Subjective:    Patient ID: Jessica Francis, female    DOB: 1960-11-06, 52 y.o.   MRN: [id number]  HPI 52 y.o.F This patient has had incidental finding of left upper lobe scarring and nodularity since the year 2000. The patient's had a series of x-rays done since that time and even dating back to the 1990s. All films are shown the same abnormality the left upper lobe. The patient has seen a variety of healthcare providers for Friday reason all of which 2 are concerned over these findings. Not all the films have been available at one time for review by one particular provider. The patient is referred to try to consolidate her care and to determine whether these nodules are truly changed.  The patient is a prior history of asthma with chronic sinus infections in the past. Although currently she only has minimal asthma symptoms and minimal cough. There is no shortness of breath. There is no fever chills or sweats. Now: no cough, no chest pain, no mucus, no recent ABX, no f/c/s.  Lifelong never smoker.  Father smoked. Spouse does not smoke.    The patient works at [employer] for [employer]. occ hand grind lenses.    Pt denies any significant sore throat, nasal congestion or excess secretions, fever, chills, sweats, unintended weight loss, pleurtic or exertional chest pain, orthopnea PND, or leg swelling Pt denies any increase in rescue therapy over baseline, denies waking up needing it or having any early am or nocturnal exacerbations of coughing/wheezing/or dyspnea. Pt also denies any obvious fluctuation in symptoms with  weather or environmental change or other alleviating or aggravating factors    Past Medical History  Diagnosis Date  . Complication of anesthesia     hard to wake up  . Asthma   . Unspecified sinusitis (chronic)   . Headache(784.0)   . Postmenopausal atrophic vaginitis   . Cardiac dysrhythmia, unspecified   . Cervicalgia   . Alopecia   . Pain in limb   . Bunion   . Pure  hypercholesterolemia      Family History  Problem Relation Age of Onset  . Heart disease Father     RHD, valve replacement  . Cancer - Lung Father   . CAD Mother 30  . Parkinsonism Mother   . CAD Brother 34  . CAD Sister 47  . Asthma Maternal Grandfather   . Arthritis Mother      History   Social History  . Marital Status: Married    Spouse Name: N/A    Number of Children: 2  . Years of Education: N/A   Occupational History  .  [employer]   Social History Main Topics  . Smoking status: Never Smoker   . Smokeless tobacco: Never Used  . Alcohol Use: No  . Drug Use: No  . Sexual Activity: Not on file   Other Topics Concern  . Not on file   Social History Narrative   Lives at home with husband and dog.      Allergies  Allergen Reactions  . Contrast Media [Iodinated Diagnostic Agents] Hives  . Doxycycline     dizzy  . Iodine Hives  . Iohexol      Outpatient Prescriptions Prior to Visit  Medication Sig Dispense Refill  . albuterol (PROVENTIL HFA;VENTOLIN HFA) 108 (90 BASE) MCG/ACT inhaler Inhale 2 puffs into the lungs every 6 (six) hours as needed for wheezing.      Marland Kitchen aspirin 81 MG tablet Take 81 mg  by mouth daily.      . beclomethasone (QVAR) 80 MCG/ACT inhaler Inhale 2 puffs into the lungs daily.       . calcium carbonate (OS-CAL - DOSED IN MG OF ELEMENTAL CALCIUM) 1250 MG tablet Take 1 tablet by mouth daily.        . cholecalciferol (VITAMIN D) 1000 UNITS tablet Take 1,000 Units by mouth daily.        Marland Kitchen estradiol-norethindrone (ACTIVELLA) 1-0.5 MG per tablet Take 1 tablet by mouth daily.        . fluticasone (FLONASE) 50 MCG/ACT nasal spray Place 2 sprays into the nose daily.        . Magnesium 500 MG CAPS Take 500 mg by mouth daily.       . Multiple Vitamin (MULTIVITAMIN) capsule Take 1 capsule by mouth daily.        . pravastatin (PRAVACHOL) 40 MG tablet Take 40 mg by mouth daily.       No facility-administered medications prior to visit.      Review of  Systems  Constitutional: Negative for fever, chills, diaphoresis, activity change, appetite change, fatigue and unexpected weight change.  HENT: Negative for congestion, dental problem, ear discharge, ear pain, facial swelling, hearing loss, mouth sores, nosebleeds, postnasal drip, rhinorrhea, sinus pressure, sneezing, sore throat, tinnitus, trouble swallowing and voice change.   Eyes: Negative for photophobia, discharge, itching and visual disturbance.  Respiratory: Negative for apnea, cough, choking, chest tightness, shortness of breath, wheezing and stridor.   Cardiovascular: Negative for chest pain, palpitations and leg swelling.  Gastrointestinal: Negative for nausea, vomiting, abdominal pain, constipation, blood in stool and abdominal distention.  Genitourinary: Negative for dysuria, urgency, frequency, hematuria, flank pain, decreased urine volume and difficulty urinating.  Musculoskeletal: Negative for arthralgias, back pain, gait problem, joint swelling, myalgias, neck pain and neck stiffness.  Skin: Negative for color change, pallor and rash.  Neurological: Negative for dizziness, tremors, seizures, syncope, speech difficulty, weakness, light-headedness, numbness and headaches.  Hematological: Negative for adenopathy. Does not bruise/bleed easily.  Psychiatric/Behavioral: Negative for confusion, sleep disturbance and agitation. The patient is not nervous/anxious.        Objective:   Physical Exam Filed Vitals:   04/07/13 1007  BP: 124/82  Pulse: 82  Temp: 98.5 F (36.9 C)  TempSrc: Oral  Height: 5\' 3"  (1.6 m)  Weight: 145 lb (65.772 kg)  SpO2: 100%    Gen: Pleasant, well-nourished, in no distress,  normal affect  ENT: No lesions,  mouth clear,  oropharynx clear, no postnasal drip  Neck: No JVD, no TMG, no carotid bruits  Lungs: No use of accessory muscles, no dullness to percussion, clear without rales or rhonchi  Cardiovascular: RRR, heart sounds normal, no murmur or  gallops, no peripheral edema  Abdomen: soft and NT, no HSM,  BS normal  Musculoskeletal: No deformities, no cyanosis or clubbing  Neuro: alert, non focal  Skin: Warm, no lesions or rashes  No results found.   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 albuterol (PROVENTIL HFA;VENTOLIN HFA) 108 (90 BASE) MCG/ACT inhaler Inhale 2 puffs into the lungs every 6 (six) hours as needed for wheezing.  Marland Kitchen aspirin 81 MG tablet Take 81 mg by mouth daily.  . beclomethasone (QVAR) 80 MCG/ACT inhaler Inhale 2 puffs into the lungs daily.   . calcium carbonate (OS-CAL - DOSED IN MG OF ELEMENTAL CALCIUM) 1250 MG tablet Take 1 tablet by mouth daily.    . cholecalciferol (VITAMIN D) 1000 UNITS tablet Take 1,000 Units by mouth daily.    Marland Kitchen estradiol-norethindrone (ACTIVELLA) 1-0.5 MG per tablet Take 1 tablet by mouth daily.    . fluticasone (FLONASE) 50 MCG/ACT nasal spray Place 2 sprays into the nose daily.    . Magnesium 500 MG CAPS Take 500 mg by mouth daily.   . Multiple Vitamin (MULTIVITAMIN) capsule Take 1 capsule by mouth daily.    . pravastatin (PRAVACHOL) 40 MG tablet Take 40 mg by mouth daily.

## 2013-04-14 ENCOUNTER — Telehealth: Payer: Self-pay | Admitting: Critical Care Medicine

## 2013-04-14 DIAGNOSIS — R911 Solitary pulmonary nodule: Secondary | ICD-10-CM

## 2013-04-14 NOTE — Telephone Encounter (Signed)
I spoke to the patient and told her CT Chest was compared to all prior films and there is NO change in LUL scar.  It has been unchanged and stable for 14 years and no further imaging studies are indicated.

## 2013-04-23 ENCOUNTER — Ambulatory Visit (INDEPENDENT_AMBULATORY_CARE_PROVIDER_SITE_OTHER): Payer: BC Managed Care – PPO | Admitting: Physician Assistant

## 2013-04-23 DIAGNOSIS — R9389 Abnormal findings on diagnostic imaging of other specified body structures: Secondary | ICD-10-CM

## 2013-04-23 DIAGNOSIS — R931 Abnormal findings on diagnostic imaging of heart and coronary circulation: Secondary | ICD-10-CM

## 2013-04-23 NOTE — Progress Notes (Signed)
Exercise Treadmill Test  Pre-Exercise Testing Evaluation Rhythm: normal sinus  Rate: 68 bpm     Test  Exercise Tolerance Test Ordering MD: Angelina Sheriff, MD  Interpreting MD: Tereso Newcomer, PA-C  Unique Test No: 1  Treadmill:  1  Indication for ETT: abn. ct scan  Contraindication to ETT: No   Stress Modality: exercise - treadmill  Cardiac Imaging Performed: non   Protocol: standard Bruce - maximal  Max BP:  176/81  Max MPHR (bpm):  167 85% MPR (bpm):  142  MPHR obtained (bpm):  173 % MPHR obtained:  103  Reached 85% MPHR (min:sec):  3:08 Total Exercise Time (min-sec):  7:03  Workload in METS:  8.6 Borg Scale: 16  Reason ETT Terminated:  desired heart rate attained    ST Segment Analysis At Rest: normal ST segments - no evidence of significant ST depression With Exercise: non-specific ST changes  Other Information Arrhythmia:  No Angina during ETT:  absent (0) Quality of ETT:  diagnostic  ETT Interpretation:  normal - no evidence of ischemia by ST analysis  Comments: Good exercise capacity. No chest pain. Normal BP response to exercise. Insignificant up-sloping ST depression at peak exercise.  No significant ST changes to suggest ischemia. Good HR recovery in 1st minute post exercise at 2 mph on 2% grade.   Recommendations: F/u with Dr. Rollene Rotunda as directed. Signed,  Tereso Newcomer, PA-C   04/23/2013 9:54 AM

## 2013-04-27 NOTE — Progress Notes (Signed)
I would like to see her in one year.

## 2013-04-28 NOTE — Progress Notes (Signed)
FYI

## 2015-01-25 DIAGNOSIS — Z8619 Personal history of other infectious and parasitic diseases: Secondary | ICD-10-CM

## 2015-01-25 DIAGNOSIS — R197 Diarrhea, unspecified: Secondary | ICD-10-CM

## 2015-01-25 DIAGNOSIS — J4530 Mild persistent asthma, uncomplicated: Secondary | ICD-10-CM

## 2015-01-25 HISTORY — DX: Mild persistent asthma, uncomplicated: J45.30

## 2015-01-25 HISTORY — DX: Diarrhea, unspecified: R19.7

## 2015-01-25 HISTORY — DX: Personal history of other infectious and parasitic diseases: Z86.19

## 2015-02-03 ENCOUNTER — Ambulatory Visit (INDEPENDENT_AMBULATORY_CARE_PROVIDER_SITE_OTHER): Payer: BLUE CROSS/BLUE SHIELD | Admitting: Physician Assistant

## 2015-02-03 ENCOUNTER — Encounter: Payer: Self-pay | Admitting: Physician Assistant

## 2015-02-03 ENCOUNTER — Ambulatory Visit (INDEPENDENT_AMBULATORY_CARE_PROVIDER_SITE_OTHER): Payer: BLUE CROSS/BLUE SHIELD

## 2015-02-03 VITALS — BP 130/80 | HR 75 | Ht 63.0 in | Wt 154.3 lb

## 2015-02-03 DIAGNOSIS — R079 Chest pain, unspecified: Secondary | ICD-10-CM | POA: Diagnosis not present

## 2015-02-03 DIAGNOSIS — R002 Palpitations: Secondary | ICD-10-CM

## 2015-02-03 NOTE — Patient Instructions (Signed)
Your physician has requested that you have en exercise stress myoview. For further information please visit [email or web address] Please follow instruction sheet, as given.  **You have a follow-up appointment with Dr Antoine Poche scheduled for October 26th, 2016 at 2:15p.  Your Doctor has ordered you to wear a heart monitor. You will wear this for 30 days.   TIPS -  REMINDERS 1. The sensor is the lanyard that is worn around your neck every day - this is powered by a battery that needs to be changed every day 2. The monitor is the device that allows you to record symptoms - this will need to be charged daily 3. The sensor & monitor need to be within 100 feet of each other at all times 4. The sensor connects to the electrodes (stickers) - these should be changed every 24-48 hours (you do not have to remove them when you bathe, just make sure they are dry when you connect it back to the sensor 5. If you need more supplies (electrodes, batteries), please call the 1-800 # on the back of the pamphlet and [employer] will mail you more supplies 6. If your skin becomes sensitive, please try the sample pack of sensitive skin electrodes (the white packet in your silver box) and call [employer] to have them mail you more of these type of electrodes 7. When you are finish wearing the monitor, please place all supplies back in the silver box, place the silver box in the pre-packaged UPS bag and drop off at [employer] or call them so they can come pick it up   Cardiac Event Monitoring A cardiac event monitor is a small recording device used to help detect abnormal heart rhythms (arrhythmias). The monitor is used to record heart rhythm when noticeable symptoms such as the following occur:  Fast heartbeats (palpitations), such as heart racing or fluttering.  Dizziness.  Fainting or light-headedness.  Unexplained weakness. The monitor is wired to two electrodes placed on your chest. Electrodes are flat, sticky disks that  attach to your skin. The monitor can be worn for up to 30 days. You will wear the monitor at all times, except when bathing.  HOW TO USE YOUR CARDIAC EVENT MONITOR A technician will prepare your chest for the electrode placement. The technician will show you how to place the electrodes, how to work the monitor, and how to replace the batteries. Take time to practice using the monitor before you leave the office. Make sure you understand how to send the information from the monitor to your health care provider. This requires a telephone with a landline, not a cell phone. You need to:  Wear your monitor at all times, except when you are in water:  Do not get the monitor wet.  Take the monitor off when bathing. Do not swim or use a hot tub with it on.  Keep your skin clean. Do not put body lotion or moisturizer on your chest.  Change the electrodes daily or any time they stop sticking to your skin. You might need to use tape to keep them on.  It is possible that your skin under the electrodes could become irritated. To keep this from happening, try to put the electrodes in slightly different places on your chest. However, they must remain in the area under your left breast and in the upper right section of your chest.  Make sure the monitor is safely clipped to your clothing or in a location close to your body that  your health care provider recommends.  Press the button to record when you feel symptoms of heart trouble, such as dizziness, weakness, light-headedness, palpitations, thumping, shortness of breath, unexplained weakness, or a fluttering or racing heart. The monitor is always on and records what happened slightly before you pressed the button, so do not worry about being too late to get good information.  Keep a diary of your activities, such as walking, doing chores, and taking medicine. It is especially important to note what you were doing when you pushed the button to record your  symptoms. This will help your health care provider determine what might be contributing to your symptoms. The information stored in your monitor will be reviewed by your health care provider alongside your diary entries.  Send the recorded information as recommended by your health care provider. It is important to understand that it will take some time for your health care provider to process the results.  Change the batteries as recommended by your health care provider. SEEK IMMEDIATE MEDICAL CARE IF:   You have chest pain.  You have extreme difficulty breathing or shortness of breath.  You develop a very fast heartbeat that persists.  You develop dizziness that does not go away.  You faint or constantly feel you are about to faint. Document Released: 02/22/2008 Document Revised: 09/29/2013 Document Reviewed: 11/11/2012 [facility] Patient Information 2015 [place], [place]. This information is not intended to replace advice given to you by your health care provider. Make sure you discuss any questions you have with your health care provider.

## 2015-02-03 NOTE — Progress Notes (Signed)
Cardiology Office Note   Date:  02/03/2015   ID:  RONNIESHA SEIBOLD, DOB 1960-08-03, MRN [id number]  PCP:  Ailene Ravel, MD  Cardiologist:  Dr Antoine Poche (2014)  [place], [place], [place]   Chief Complaint  Patient presents with  . Advice Only    patient reports having fast heart beating X3. felt like she was going to pass out. assoicateds with dizziness. also complains of chest ansd left arm pain. she has had shingles since June 17th.    History of Present Illness: Jessica Francis is a 54 y.o. female with a history of FH premature CAD, HL, evaluated by Dr Antoine Poche in 2014 for abnormal  Chest CT showing coronary calcifications. POET showed no ECG changes. Primary MD follows lipids.  Jessica Francis presents for  Evaluation of chest pain and palpitations.    Jessica Francis had shingles in June and shortly after being started on prednisone for that, developed diarrhea.   The shingles have greatly improved but she is still dealing from some of the sequela. The diarrhea had not improved.   She had sudden onset of palpitations 3 weeks ago that only lasted for a few seconds but made her heart feel like it was beating out of her chest. She got lightheaded but had no chest pain. She had another episode 2 weeks ago and then another episode last week. She went to the emergency room after the last episode, and was told there that her ECG was okay, her TSH was normal, her magnesium was normal and her potassium was low. She was given multiple potassium tablets for that.  She was also told that she was having PACs and PVCs.  After that, she started drinking Gatorade and went to see her GI M.D.  The GI  M.D. Adjusted medications and her diarrhea improved. She has not had any severe palpitations in the last few days, but will feel her heart skip frequently.  She has not had any more lightheaded spells.   the chest pain has been starting at rest. She will get multiple episodes. There is no association with  exertion. She does have some left arm discomfort. It does not change with activity. It reaches a 3 or 4/10. The episodes are prolonged. She has not tried any medications for them. Sometimes burping helps a little. She is not currently having any discomfort.   Past Medical History  Diagnosis Date  . Complication of anesthesia     hard to wake up  . Asthma   . Unspecified sinusitis (chronic)   . Headache(784.0)   . Postmenopausal atrophic vaginitis   . Cardiac dysrhythmia, unspecified   . Cervicalgia   . Alopecia   . Pain in limb   . Bunion   . Pure hypercholesterolemia     Past Surgical History  Procedure Laterality Date  . Tubal ligation    . Shoulder arthroscopy  1997    lt  . Wrist arthroplasty  9/12    lt -[facility]  . Hardware removal  05/04/2011    Procedure: HARDWARE REMOVAL;  Surgeon: Wyn Forster., MD;  Location: [facility];  Service: Orthopedics;  Laterality: Left;  DVR plate removal left wrist,    Current Outpatient Prescriptions  Medication Sig Dispense Refill  . albuterol (PROVENTIL HFA;VENTOLIN HFA) 108 (90 BASE) MCG/ACT inhaler Inhale 2 puffs into the lungs every 6 (six) hours as needed for wheezing.    Marland Kitchen aspirin 81 MG tablet Take 81 mg  by mouth daily.    . beclomethasone (QVAR) 80 MCG/ACT inhaler Inhale 2 puffs into the lungs daily.     . calcium carbonate (OS-CAL - DOSED IN MG OF ELEMENTAL CALCIUM) 1250 MG tablet Take 1 tablet by mouth daily.      . cholecalciferol (VITAMIN D) 1000 UNITS tablet Take 1,000 Units by mouth daily.      Marland Kitchen estradiol-norethindrone (ACTIVELLA) 1-0.5 MG per tablet Take 1 tablet by mouth daily.      . fluticasone (FLONASE) 50 MCG/ACT nasal spray Place 2 sprays into the nose daily.      . Magnesium 500 MG CAPS Take 500 mg by mouth daily.     . Multiple Vitamin (MULTIVITAMIN) capsule Take 1 capsule by mouth daily.      . pravastatin (PRAVACHOL) 40 MG tablet Take 40 mg by mouth daily.     No current  facility-administered medications for this visit.    Allergies:   Contrast media; Doxycycline; Iodine; and Iohexol    Social History:  The patient  reports that she has never smoked. She has never used smokeless tobacco. She reports that she does not drink alcohol or use illicit drugs.   Family History:  The patient's family history includes Arthritis in her mother; Asthma in her maternal grandfather; CAD (age of onset: 62) in her brother; CAD (age of onset: 80) in her sister; CAD (age of onset: 52) in her mother; Cancer - Lung in her father; Heart disease in her father; Parkinsonism in her mother.    ROS:  Please see the history of present illness. All other systems are reviewed and negative.    PHYSICAL EXAM: VS:  BP 130/80 mmHg  Pulse 75  Ht  (1.6 m)  Wt 154 lb 4.8 oz (69.99 kg)  BMI 27.34 kg/m2 , BMI Body mass index is 27.34 kg/(m^2). GEN: Well nourished, well developed, in no acute distress HEENT: normal  Neck: no JVD, carotid bruits, or masses Cardiac: RRR;  soft murmur,  no rubs, or gallops,no edema  Respiratory:  clear to auscultation bilaterally, normal work of breathing GI: soft, nontender, nondistended, + BS MS: no deformity or atrophy Skin: warm and dry, no rash Neuro:  Strength and sensation are intact Psych: euthymic mood, full affect   EKG:  EKG is ordered today. The ekg ordered today demonstrates  Sinus rhythm, no ST depression, no pathologic Q waves   Recent Labs: No results found for requested labs within last 365 days.    Lipid Panel No results found for: CHOL, TRIG, HDL, CHOLHDL, VLDL, LDLCALC, LDLDIRECT   Wt Readings from Last 3 Encounters:  02/03/15 154 lb 4.8 oz (69.99 kg)  04/07/13 145 lb (65.772 kg)  03/14/13 146 lb 12.8 oz (66.588 kg)     Other studies Reviewed: Additional studies/ records that were reviewed today include:  Previous office notes, ECG and stress test.  ASSESSMENT AND PLAN:  1.   Palpitations: the palpitations were in  the setting of hypokalemia. Her GI symptoms and therefore most likely the hypokalemia have improved. The palpitations improved with this. However, because they made her lightheaded,  I will order a 30 day event monitor. She will follow-up with Dr. Antoine Poche after that.   2. Chest pain: her symptoms are atypical and the patient was reassured regarding this. However, she has multiple cardiac risk factors and had coronary calcifications on previous chest CT without contrast. The the options were reviewed with the patient including nuclear stress testing and cardiac CT.  The decision was made to pursue a nuclear stress test. She is to continue aspirin and statin therapy.   3. Hyperlipidemia and recent hypokalemia: Jessica Francis has a follow-up appointment scheduled with Dr. Nathanial Rancher. She will try to obtain current labs from Dr. Nathanial Rancher and the labs are available from [facility] get those well and fax them over to [place].  Current medicines are reviewed at length with the patient today.  The patient does not have concerns regarding medicines.  The following changes have been made:  no change  Labs/ tests ordered today include:   Orders Placed This Encounter  Procedures  . Cardiac event monitor  . Myocardial Perfusion Imaging     Disposition:   FU with Dr. Antoine Poche after the event monitor.   Jessica Francis  02/03/2015 9:15 AM    [facility] [address] [place], [place], [place]  [address] Phone: [phone]; Fax: [phone]

## 2015-02-09 ENCOUNTER — Telehealth (HOSPITAL_COMMUNITY): Payer: Self-pay

## 2015-02-09 NOTE — Telephone Encounter (Signed)
Encounter complete. 

## 2015-02-11 ENCOUNTER — Ambulatory Visit (HOSPITAL_COMMUNITY)
Admission: RE | Admit: 2015-02-11 | Discharge: 2015-02-11 | Disposition: A | Discharge status: Home / Self Care | Payer: BLUE CROSS/BLUE SHIELD | Source: Ambulatory Visit | Attending: Cardiology | Admitting: Cardiology

## 2015-02-11 DIAGNOSIS — R42 Dizziness and giddiness: Secondary | ICD-10-CM | POA: Diagnosis not present

## 2015-02-11 DIAGNOSIS — Z8249 Family history of ischemic heart disease and other diseases of the circulatory system: Secondary | ICD-10-CM | POA: Insufficient documentation

## 2015-02-11 DIAGNOSIS — R079 Chest pain, unspecified: Secondary | ICD-10-CM

## 2015-02-11 DIAGNOSIS — R002 Palpitations: Secondary | ICD-10-CM | POA: Insufficient documentation

## 2015-02-11 DIAGNOSIS — E785 Hyperlipidemia, unspecified: Secondary | ICD-10-CM | POA: Insufficient documentation

## 2015-02-11 DIAGNOSIS — R0789 Other chest pain: Secondary | ICD-10-CM | POA: Diagnosis present

## 2015-02-11 LAB — MYOCARDIAL PERFUSION IMAGING
CHL CUP RESTING HR STRESS: 65 {beats}/min
CHL RATE OF PERCEIVED EXERTION: 16
CSEPED: 6 min
CSEPEDS: 36 s
CSEPEW: 7.8 METS
CSEPPHR: 157 {beats}/min
LV dias vol: 77 mL
LVSYSVOL: 31 mL
MPHR: 166 {beats}/min
Percent HR: 94 %
SDS: 0
SRS: 1
SSS: 1
TID: 1.18

## 2015-02-11 MED ORDER — TECHNETIUM TC 99M SESTAMIBI GENERIC - CARDIOLITE
31.6000 | Freq: Once | INTRAVENOUS | Status: AC | PRN
Start: 2015-02-11 — End: 2015-02-11
  Administered 2015-02-11: 31.6 via INTRAVENOUS

## 2015-02-11 MED ORDER — TECHNETIUM TC 99M SESTAMIBI GENERIC - CARDIOLITE
10.9000 | Freq: Once | INTRAVENOUS | Status: AC | PRN
  Administered 2015-02-11: 10.9 via INTRAVENOUS

## 2015-02-17 NOTE — Addendum Note (Signed)
Addended by: Neta Ehlers on: 02/17/2015 02:08 PM   Modules accepted: Orders

## 2015-02-19 ENCOUNTER — Telehealth: Payer: Self-pay | Admitting: Cardiology

## 2015-02-19 NOTE — Telephone Encounter (Signed)
Jessica Francis is calling because she having some left arm pain for several days which comes and goes . And then he left shoulder blade is hurting.. Please call   Thanks

## 2015-02-19 NOTE — Telephone Encounter (Signed)
Has same type of discomfort and arrhythmias seen was seen by Theodore Demark for earlier September.  She has an event monitor in place.  Reviewed 24 hour reports.  Jessica Bible says she has more frequent and intense episodes of pain in her left arm and shoulder area  A couple days ago she felt her left arm and shoulder hurting with skipped beats, felt dizzy and the desire to move bowels  Flex appointment with Hetty Ely Tuesday at 430pm at [facility]  Copy of 24 hour event monitor summaries printed, Occasional UF PVC's PAC;s  Routed to Dr. Durene Fruits

## 2015-02-23 ENCOUNTER — Ambulatory Visit (INDEPENDENT_AMBULATORY_CARE_PROVIDER_SITE_OTHER): Payer: BLUE CROSS/BLUE SHIELD | Admitting: Physician Assistant

## 2015-02-23 ENCOUNTER — Encounter: Payer: Self-pay | Admitting: Physician Assistant

## 2015-02-23 VITALS — BP 118/78 | HR 81 | Ht 63.0 in | Wt 158.2 lb

## 2015-02-23 DIAGNOSIS — M79602 Pain in left arm: Secondary | ICD-10-CM

## 2015-02-23 DIAGNOSIS — I493 Ventricular premature depolarization: Secondary | ICD-10-CM | POA: Insufficient documentation

## 2015-02-23 DIAGNOSIS — I2510 Atherosclerotic heart disease of native coronary artery without angina pectoris: Secondary | ICD-10-CM | POA: Diagnosis not present

## 2015-02-23 DIAGNOSIS — I491 Atrial premature depolarization: Secondary | ICD-10-CM

## 2015-02-23 HISTORY — DX: Ventricular premature depolarization: I49.3

## 2015-02-23 HISTORY — DX: Pain in left arm: M79.602

## 2015-02-23 HISTORY — DX: Atrial premature depolarization: I49.1

## 2015-02-23 MED ORDER — METOPROLOL TARTRATE 25 MG PO TABS: ORAL_TABLET | ORAL | Status: DC

## 2015-02-23 NOTE — Patient Instructions (Signed)
Start Metoprolol 25 mg 1/2 tablet twice a day   Keep appointment with Dr.Hochrein  Wednesday 03/24/15 at 2:15 pm.

## 2015-02-23 NOTE — Progress Notes (Signed)
Patient ID: Jessica Francis, female   DOB: 08-16-1960, 54 y.o.   MRN: [id number]    Date:  02/23/2015   ID:  RUFUS CYPERT, DOB 1960-06-09, MRN [id number]  PCP:  Ailene Ravel, MD  Primary Cardiologist: Hochrein  Chief complaint: Palpitations, left arm pain   History of Present Illness: Jessica Francis is a 54 y.o. female with a history of FH premature CAD, HL, evaluated by Dr Antoine Poche in 2014 for abnormal Chest CT showing coronary calcifications. POET showed no ECG changes. Primary MD follows lipids.  She had shingles in June and was started on prednisone. Patient was seen by Theodore Demark on September 7 for palpitations and left arm pain.  3 weeks prior to that she had sudden onset of palpitations that only lasted for a few seconds but made her heart feel like it was beating out of her chest. She got lightheaded but had no chest pain. She had another episode week later and then a week after that. She went to the emergency room after the last episode, and was told there that her ECG was okay, her TSH was normal, her magnesium was normal and her potassium was low. She was given multiple potassium tablets. She was also told that she was having PACs and PVCs.  She underwent a nuclear stress test on 02/11/2015 which was nonischemic and low risk.  She was also placed on a CardioNet monitor.  Patient presents for follow-up evaluation. She reports this past Friday she was sitting at her desk at work and she had a prolonged spell of heart skipping and racing along with left arm pain and dizziness. The pain in her left arm was dull.  On Saturday starting around 10 or 11 night she had a lot of palpitations. Review of the CardioNet monitor showed frequent PVCs and some very short atrial runs.  She had one episode of sinus tachycardia with a heart rate of 123 bpm which was on September 21 and she recorded skipping beats and dizziness. She was engaging in light activity at that time.     The patient  currently denies nausea, vomiting, fever, chest pain, shortness of breath, orthopnea, dizziness, PND, cough, congestion, abdominal pain, hematochezia, melena, lower extremity edema, claudication.  Wt Readings from Last 3 Encounters:  02/23/15 158 lb 3.2 oz (71.759 kg)  02/11/15 154 lb (69.854 kg)  02/03/15 154 lb 4.8 oz (69.99 kg)    Family History  Problem Relation Age of Onset  . Heart disease Father     RHD, valve replacement  . Cancer - Lung Father   . CAD Mother 43  . Parkinsonism Mother   . CAD Brother 38  . CAD Sister 29  . Asthma Maternal Grandfather   . Arthritis Mother     Past Medical History  Diagnosis Date  . Complication of anesthesia     hard to wake up  . Asthma   . Unspecified sinusitis (chronic)   . Headache(784.0)   . Postmenopausal atrophic vaginitis   . Cardiac dysrhythmia, unspecified   . Cervicalgia   . Alopecia   . Pain in limb   . Bunion   . Pure hypercholesterolemia     Current Outpatient Prescriptions  Medication Sig Dispense Refill  . albuterol (PROVENTIL HFA;VENTOLIN HFA) 108 (90 BASE) MCG/ACT inhaler Inhale 2 puffs into the lungs every 6 (six) hours as needed for wheezing.    Marland Kitchen aspirin 81 MG tablet Take 81 mg by mouth daily.    Marland Kitchen  beclomethasone (QVAR) 80 MCG/ACT inhaler Inhale 2 puffs into the lungs daily.     . calcium carbonate (OS-CAL - DOSED IN MG OF ELEMENTAL CALCIUM) 1250 MG tablet Take 1 tablet by mouth daily.      . cholecalciferol (VITAMIN D) 1000 UNITS tablet Take 1,000 Units by mouth daily.      Marland Kitchen estradiol-norethindrone (ACTIVELLA) 1-0.5 MG per tablet Take 1 tablet by mouth daily.      . fluticasone (FLONASE) 50 MCG/ACT nasal spray Place 2 sprays into the nose daily.      . Magnesium 500 MG CAPS Take 500 mg by mouth daily.     . Multiple Vitamin (MULTIVITAMIN) capsule Take 1 capsule by mouth daily.      . pravastatin (PRAVACHOL) 40 MG tablet Take 40 mg by mouth daily.    . metoprolol tartrate (LOPRESSOR) 25 MG tablet Take 1/2  tablet twice a day 30 tablet 6   No current facility-administered medications for this visit.    Allergies:    Allergies  Allergen Reactions  . Contrast Media [Iodinated Diagnostic Agents] Hives  . Iodine Hives  . Doxycycline Other (See Comments)    dizzy  . Iohexol Hives    Social History:  The patient  reports that she has never smoked. She has never used smokeless tobacco. She reports that she does not drink alcohol or use illicit drugs.   Family history:   Family History  Problem Relation Age of Onset  . Heart disease Father     RHD, valve replacement  . Cancer - Lung Father   . CAD Mother 29  . Parkinsonism Mother   . CAD Brother 28  . CAD Sister 72  . Asthma Maternal Grandfather   . Arthritis Mother     ROS:  Please see the history of present illness.  All other systems reviewed and negative.   PHYSICAL EXAM: VS:  BP 118/78 mmHg  Pulse 81  Ht  (1.6 m)  Wt 158 lb 3.2 oz (71.759 kg)  BMI 28.03 kg/m2  SpO2 97% Overweight, well developed, in no acute distress HEENT: Pupils are equal round react to light accommodation extraocular movements are intact.  Neck: No cervical lymphadenopathy. Cardiac: Regular rate and rhythm without murmurs rubs or gallops. Lungs:  clear to auscultation bilaterally, no wheezing, rhonchi or rales Ext: Trace lower extremity edema.  2+ radial and dorsalis pedis pulses. Skin: warm and dry Neuro:  Grossly normal  EKG:  Sinus rhythm rate 81 bpm   ASSESSMENT AND PLAN:  Problem List Items Addressed This Visit    Premature ventricular contractions   Relevant Medications   metoprolol tartrate (LOPRESSOR) 25 MG tablet   Premature atrial contractions   Relevant Medications   metoprolol tartrate (LOPRESSOR) 25 MG tablet   Left arm pain   CAD in native artery - Primary   Relevant Medications   metoprolol tartrate (LOPRESSOR) 25 MG tablet   Other Relevant Orders   EKG 12-Lead     PACs and PVCs: We'll add a low dose of metoprolol  12.5 mg twice daily and see if this helps lessen her palpitations. She will monitor her blood pressure at home which is currently is well-controlled. I'll be scheduled  Left arm pain This is atypical and nonexertional. It is not exacerbated with active motion of her left arm. She has 2+ left radial pulse.  Low-risk nuclear stress test this month.

## 2015-03-18 DIAGNOSIS — H9042 Sensorineural hearing loss, unilateral, left ear, with unrestricted hearing on the contralateral side: Secondary | ICD-10-CM | POA: Insufficient documentation

## 2015-03-18 DIAGNOSIS — H905 Unspecified sensorineural hearing loss: Secondary | ICD-10-CM | POA: Insufficient documentation

## 2015-03-18 HISTORY — DX: Unspecified sensorineural hearing loss: H90.5

## 2015-03-23 ENCOUNTER — Telehealth: Payer: Self-pay | Admitting: Cardiology

## 2015-03-23 NOTE — Telephone Encounter (Signed)
[facility]/24/15.  [facility] for Dr Hochrein's schedule on 03/24/15. lp

## 2015-03-24 ENCOUNTER — Ambulatory Visit (INDEPENDENT_AMBULATORY_CARE_PROVIDER_SITE_OTHER): Payer: BLUE CROSS/BLUE SHIELD | Admitting: Cardiology

## 2015-03-24 ENCOUNTER — Encounter: Payer: Self-pay | Admitting: Cardiology

## 2015-03-24 VITALS — BP 118/72 | HR 86 | Ht 63.0 in | Wt 157.6 lb

## 2015-03-24 DIAGNOSIS — I2510 Atherosclerotic heart disease of native coronary artery without angina pectoris: Secondary | ICD-10-CM | POA: Diagnosis not present

## 2015-03-24 MED ORDER — METOPROLOL TARTRATE 25 MG PO TABS: 25.0000 mg | ORAL_TABLET | Freq: Three times a day (TID) | ORAL | Status: DC | PRN

## 2015-03-24 NOTE — Progress Notes (Signed)
Cardiology Office Note   Date:  03/24/2015   ID:  AZZURE GARABEDIAN, DOB [birth year], MRN [id number]  PCP:  Ailene Ravel, MD  Cardiologist:   Rollene Rotunda, MD   Chief Complaint  Patient presents with  . Follow-up    some dizziness  . Chest Pain    pt c/o some chest pain that radiates in her back, happen today  . Shortness of Breath    no SOB  . Edema    no sweling in legs      History of Present Illness: Jessica Francis is a 54 y.o. female who presents for  Follow-up of palpitations and coronary calcification. She has had coronary calcification I have seen her for this before. Most recently she had some chest discomfort and in September had a negative stress perfusion study. She has had palpitations and I reviewed with her in the office results of a monitor where she does have some PVCs. She has been treated with when necessary beta blocker. She continues to have these and sometimes severely even waking her from her sleep.  She stopped taking the beta blocker because of asthma that started this recently.   She's not sure right now with the beta blocker has helped. She denies any presyncope or syncope. She has been stressed because she had rather severe zoster. She had a lot of pain with this.   of note I did review records from [facility]. The patient was hospitalized there with palpitations and some GI problems. There were no acute cardiac issues.  Past Medical History  Diagnosis Date  . Complication of anesthesia     hard to wake up  . Asthma   . Unspecified sinusitis (chronic)   . Headache(784.0)   . Postmenopausal atrophic vaginitis   . Cardiac dysrhythmia, unspecified   . Cervicalgia   . Alopecia   . Pain in limb   . Bunion   . Pure hypercholesterolemia     Past Surgical History  Procedure Laterality Date  . Tubal ligation    . Shoulder arthroscopy  1997    lt  . Wrist arthroplasty  9/12    lt -[facility]  . Hardware removal  05/04/2011   Procedure: HARDWARE REMOVAL;  Surgeon: Wyn Forster., MD;  Location: [facility];  Service: Orthopedics;  Laterality: Left;  DVR plate removal left wrist,     Current Outpatient Prescriptions  Medication Sig Dispense Refill  . albuterol (PROVENTIL HFA;VENTOLIN HFA) 108 (90 BASE) MCG/ACT inhaler Inhale 2 puffs into the lungs every 6 (six) hours as needed for wheezing.    Marland Kitchen aspirin 81 MG tablet Take 81 mg by mouth daily.    . beclomethasone (QVAR) 80 MCG/ACT inhaler Inhale 2 puffs into the lungs daily.     . calcium carbonate (OS-CAL - DOSED IN MG OF ELEMENTAL CALCIUM) 1250 MG tablet Take 1 tablet by mouth daily.      . cholecalciferol (VITAMIN D) 1000 UNITS tablet Take 1,000 Units by mouth daily.      Marland Kitchen estradiol-norethindrone (ACTIVELLA) 1-0.5 MG per tablet Take 1 tablet by mouth daily.      . fluticasone (FLONASE) 50 MCG/ACT nasal spray Place 2 sprays into the nose daily.      . Magnesium 500 MG CAPS Take 500 mg by mouth daily.     . metoprolol tartrate (LOPRESSOR) 25 MG tablet Take 1/2 tablet twice a day 30 tablet 6  . Multiple Vitamin (MULTIVITAMIN) capsule  Take 1 capsule by mouth daily.      . pravastatin (PRAVACHOL) 40 MG tablet Take 40 mg by mouth daily.     No current facility-administered medications for this visit.    Allergies:   Contrast media; Iodine; Doxycycline; and Iohexol    ROS:  Please see the history of present illness.   Otherwise, review of systems are positive for positive for burping GI problems.   All other systems are reviewed and negative.    PHYSICAL EXAM: VS:  BP 118/72 mmHg  Pulse 86  Ht 5\' 3"  (1.6 m)  Wt 157 lb 9.6 oz (71.487 kg)  BMI 27.92 kg/m2 , BMI Body mass index is 27.92 kg/(m^2). GENERAL:  Well appearing HEENT:  Pupils equal round and reactive, fundi not visualized, oral mucosa unremarkable NECK:  No jugular venous distention, waveform within normal limits, carotid upstroke brisk and symmetric, no bruits, no  thyromegaly LYMPHATICS:  No cervical, inguinal adenopathy LUNGS:  Clear to auscultation bilaterally BACK:  No CVA tenderness CHEST:  Unremarkable HEART:  PMI not displaced or sustained,S1 and S2 within normal limits, no S3, no S4, no clicks, no rubs, no murmurs ABD:  Flat, positive bowel sounds normal in frequency in pitch, no bruits, no rebound, no guarding, no midline pulsatile mass, no hepatomegaly, no splenomegaly EXT:  2 plus pulses throughout, no edema, no cyanosis no clubbing SKIN:  No rashes no nodules NEURO:  Cranial nerves II through XII grossly intact, motor grossly intact throughout PSYCH:  Cognitively intact, oriented to person place and time    EKG:  EKG is not ordered today.   Recent Labs: No results found for requested labs within last 365 days.    Lipid Panel No results found for: CHOL, TRIG, HDL, CHOLHDL, VLDL, LDLCALC, LDLDIRECT    Wt Readings from Last 3 Encounters:  03/24/15 157 lb 9.6 oz (71.487 kg)  02/23/15 158 lb 3.2 oz (71.759 kg)  02/11/15 154 lb (69.854 kg)      Other studies Reviewed: Additional studies/ records that were reviewed today include: [facility].. Review of the above records demonstrates:  Please see elsewhere in the note.     ASSESSMENT AND PLAN:  CORONARY CALCIUM:   She had a negative stress perfusion study. She does need aggressive risk reduction. I did review her lipids and her LDL recently was 101 but her HDL 74. I think this is a good ratio. She will remain on the meds as listed. I will do periodic stress testing or based on future symptoms.  PVCs:   We talked about when necessary dosing of beta blocker. This doesn't work ultimately we might try a calcium channel blocker.  PREOP:   The patient is at acceptable risk for planned ear surgery.  Current medicines are reviewed at length with the patient today.  The patient does not have concerns regarding medicines.  The following changes have been made:  no  change  Labs/ tests ordered today include: None  No orders of the defined types were placed in this encounter.     Disposition:   FU with me in six months. Forest Becker.     Signed, Alvera Tourigny, MD  03/24/2015 2:57 PM    [facility]

## 2015-03-24 NOTE — Patient Instructions (Signed)
Your physician wants you to follow-up in: 6 Months. You will receive a reminder letter in the mail two months in advance. If you don't receive a letter, please call our office to schedule the follow-up appointment.  Your physician has recommended you make the following change in your medication: Take Metoprolol every 8 hours as needed

## 2015-04-05 DIAGNOSIS — H7201 Central perforation of tympanic membrane, right ear: Secondary | ICD-10-CM | POA: Insufficient documentation

## 2015-04-05 DIAGNOSIS — H9011 Conductive hearing loss, unilateral, right ear, with unrestricted hearing on the contralateral side: Secondary | ICD-10-CM

## 2015-04-05 HISTORY — DX: Central perforation of tympanic membrane, right ear: H72.01

## 2015-04-05 HISTORY — DX: Conductive hearing loss, unilateral, right ear, with unrestricted hearing on the contralateral side: H90.11

## 2015-04-26 DIAGNOSIS — I2510 Atherosclerotic heart disease of native coronary artery without angina pectoris: Secondary | ICD-10-CM

## 2015-04-26 DIAGNOSIS — M47817 Spondylosis without myelopathy or radiculopathy, lumbosacral region: Secondary | ICD-10-CM

## 2015-04-26 HISTORY — DX: Spondylosis without myelopathy or radiculopathy, lumbosacral region: M47.817

## 2015-04-26 HISTORY — DX: Atherosclerotic heart disease of native coronary artery without angina pectoris: I25.10

## 2016-02-14 DIAGNOSIS — D721 Eosinophilia: Secondary | ICD-10-CM | POA: Diagnosis not present

## 2016-03-02 DIAGNOSIS — K2960 Other gastritis without bleeding: Secondary | ICD-10-CM

## 2016-03-02 DIAGNOSIS — K828 Other specified diseases of gallbladder: Secondary | ICD-10-CM

## 2016-03-02 DIAGNOSIS — K811 Chronic cholecystitis: Secondary | ICD-10-CM | POA: Insufficient documentation

## 2016-03-02 HISTORY — DX: Chronic cholecystitis: K81.1

## 2016-03-02 HISTORY — DX: Other specified diseases of gallbladder: K82.8

## 2016-03-02 HISTORY — DX: Other gastritis without bleeding: K29.60

## 2016-03-06 DIAGNOSIS — K812 Acute cholecystitis with chronic cholecystitis: Secondary | ICD-10-CM | POA: Insufficient documentation

## 2016-03-06 HISTORY — DX: Acute cholecystitis with chronic cholecystitis: K81.2

## 2016-06-05 DIAGNOSIS — D721 Eosinophilia: Secondary | ICD-10-CM | POA: Diagnosis not present

## 2016-12-11 DIAGNOSIS — D721 Eosinophilia: Secondary | ICD-10-CM | POA: Diagnosis not present

## 2017-01-01 ENCOUNTER — Encounter: Payer: Self-pay | Admitting: Cardiology

## 2017-01-01 ENCOUNTER — Ambulatory Visit (INDEPENDENT_AMBULATORY_CARE_PROVIDER_SITE_OTHER): Payer: BLUE CROSS/BLUE SHIELD | Admitting: Cardiology

## 2017-01-01 VITALS — BP 144/68 | HR 76 | Ht 63.0 in | Wt 170.0 lb

## 2017-01-01 DIAGNOSIS — I491 Atrial premature depolarization: Secondary | ICD-10-CM

## 2017-01-01 DIAGNOSIS — E785 Hyperlipidemia, unspecified: Secondary | ICD-10-CM

## 2017-01-01 DIAGNOSIS — I493 Ventricular premature depolarization: Secondary | ICD-10-CM | POA: Diagnosis not present

## 2017-01-01 DIAGNOSIS — I2510 Atherosclerotic heart disease of native coronary artery without angina pectoris: Secondary | ICD-10-CM | POA: Diagnosis not present

## 2017-01-01 MED ORDER — METOPROLOL TARTRATE 25 MG PO TABS: 37.5000 mg | ORAL_TABLET | Freq: Two times a day (BID) | ORAL | 6 refills | Status: DC

## 2017-01-01 NOTE — Progress Notes (Signed)
Cardiology Office Note:    Date:  01/01/2017   ID:  Jessica Francis, DOB May 16, 1961, MRN [id number]  PCP:  Paulina FusiSchultz, Douglas E, MD  Cardiologist:  Norman HerrlichBrian Merna Baldi, MD    Referring MD: Ailene RavelHamrick, Maura L, MD    ASSESSMENT:    1. Premature atrial contraction   2. Ventricular premature beats   3. Coronary artery calcification seen on CT scan   4. Hyperlipidemia, unspecified hyperlipidemia type   5. CAD in native artery    PLAN:    In order of problems listed above:  1. Worsened she'll increase her dose of beta blocker and if ineffective require an ambulatory monitor and consideration referral to EP especially if she is a high frequency of monomorphic PVCs 2. See above 3. Stable having no anginal discomfort continue aspirin and high intensity statin 4. Stable continue her statin recent labs requested from her PCP   Next appointment: 6 months   Medication Adjustments/Labs and Tests Ordered: Current medicines are reviewed at length with the patient today.  Concerns regarding medicines are outlined above.  Orders Placed This Encounter  Procedures  . EKG 12-Lead   Meds ordered this encounter  Medications  . metoprolol tartrate (LOPRESSOR) 25 MG tablet    Sig: Take 1.5 tablets (37.5 mg total) by mouth 2 (two) times daily.    Dispense:  90 tablet    Refill:  6    Chief Complaint  Patient presents with  . 1 year follow up    History of Present Illness:    Jessica Francis is a 56 y.o. female with a hx of Palpitations, with frequent PVC's and  APC's  last seen one year ago.She is having more frequent episodes of brief rapid heart rhythm. We had tried in the past using a more selective beta blocker that was subclinical failure. She avoids over-the-counter proarrhythmic's. And is taking a over-the-counter magnesium supplement. The rhythm abnormality has not been sustained as been no syncope chest pain shortness of breath. Compliance with diet, lifestyle and medications: yes Past Medical  History:  Diagnosis Date  . Acute cholecystitis with chronic cholecystitis 03/06/2016  . Alopecia   . Asthma   . Biliary dyskinesia 03/02/2016  . Bunion   . CAD in native artery 03/15/2013  . Cardiac dysrhythmia, unspecified   . 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 Cervicalgia   . Cholecystitis, chronic 03/02/2016  . Complication of anesthesia    hard to wake up  . Conductive hearing loss in right ear 04/05/2015  . Coronary artery calcification seen on CT scan 04/26/2015   Overview:  Normal stress MPS at 7.8 mets Sept 2016 EF 60%  . Diarrhea in adult patient 01/25/2015  . Erosive gastritis 03/02/2016  . Headache(784.0)   . 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 History of shingles 01/25/2015   Overview:  In 10/2014; had full therapy; right scapula area  . Hyperlipidemia 04/07/2013  . Left arm pain 02/23/2015  . Lumbosacral spondylosis 04/26/2015  . Lung mass 04/07/2013   Chronic scarring left upper lobe does not represent a malignant process and does NOT NEED  further imaging as has not changed since the year 2000. Pt is aware and Radiology has issued a supplement report indicating no changes.  Overview:  Overview:  Chronic scarring left upper lobe does not represent a malignant process and does NOT NEED  further imaging as has not changed since the year 2000. Pt is aware  and Radiology has issued a supplement report indicating no changes.  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 Mild persistent asthma  without complication 01/25/2015  . Moderate persistent asthma 04/07/2013  . Pain in limb   . Postmenopausal atrophic vaginitis   . Premature atrial contraction 02/23/2015  . Pure hypercholesterolemia   . Unspecified sinusitis (chronic)   . Ventricular premature beats 02/23/2015   Overview:  With hypokalemia    Past Surgical History:  Procedure Laterality Date  . HARDWARE REMOVAL  05/04/2011   Procedure: HARDWARE REMOVAL;  Surgeon: Wyn Forster., MD;  Location: [facility];  Service: Orthopedics;  Laterality: Left;  DVR plate removal left wrist,  . SHOULDER ARTHROSCOPY  1997   lt  . TUBAL LIGATION    . WRIST ARTHROPLASTY  9/12   lt -[facility]    Current Medications: Current Meds  Medication Sig  . albuterol (PROVENTIL HFA;VENTOLIN HFA) 108 (90 BASE) MCG/ACT inhaler Inhale 2 puffs into the lungs every 6 (six) hours as needed for wheezing.  Marland Kitchen aspirin 81 MG tablet Take 81 mg by mouth daily.  . beclomethasone (QVAR) 80 MCG/ACT inhaler Inhale 2 puffs into the lungs daily.   . calcium carbonate (OS-CAL - DOSED IN MG OF ELEMENTAL CALCIUM) 1250 MG tablet Take 1 tablet by mouth daily.    . cholecalciferol (VITAMIN D) 1000 UNITS tablet Take 1,000 Units by mouth daily.    Marland Kitchen estradiol-norethindrone (ACTIVELLA) 1-0.5 MG per tablet Take 1 tablet by mouth daily.    . fluticasone (FLONASE) 50 MCG/ACT nasal spray Place 2 sprays into the nose daily.    . Magnesium 500 MG CAPS Take 500 mg by mouth daily.   . metoprolol tartrate (LOPRESSOR) 25 MG tablet Take 1.5 tablets (37.5 mg total) by mouth 2 (two) times daily.  . Multiple Vitamin (MULTIVITAMIN) capsule Take 1 capsule by mouth daily.    . rosuvastatin (CRESTOR) 10 MG tablet Take 10 mg by mouth daily.  . [DISCONTINUED] metoprolol tartrate (LOPRESSOR) 25 MG tablet Take 1 tablet (25 mg total) by mouth every 8 (eight) hours as needed. (Patient taking differently: Take 25 mg by mouth 2 (two) times daily. )     Allergies:    Iodine; Metrizamide; Prednisone; Contrast media [iodinated diagnostic agents]; Doxycycline; and Iohexol   Social History   Social History  . Marital status: Married    Spouse name: N/A  . Number of children: 2  . Years of education: N/A   Occupational History  .  [employer]   Social History Main Topics  . Smoking status: Never Smoker  . Smokeless tobacco: Never Used  . Alcohol use No  . Drug use: No  . Sexual activity: Not Asked   Other Topics Concern  . None   Social History Narrative   Lives at home with husband and dog.      Family History: The patient's family history includes Arthritis in her mother; Asthma in her maternal grandfather; CAD (age of onset: 83) in her brother; CAD (  age of onset: 75) in her sister; CAD (age of onset: 33) in her mother; Cancer - Lung in her father; Heart disease in her father; Parkinsonism in her mother. ROS:   Please see the history of present illness.    All other systems reviewed and are negative.  EKGs/Labs/Other Studies Reviewed:    The following studies were reviewed today:  EKG:  EKG ordered today.  The ekg ordered today demonstrates Sinus rhythm normal  Recent Labs: No results found for requested labs within last 8760 hours.  Recent Lipid Panel No results found for: CHOL, TRIG, HDL, CHOLHDL, VLDL, LDLCALC, LDLDIRECT  Physical Exam:    VS:  BP (!) 144/68   Pulse 76   Ht 5\' 3"  (1.6 m)   Wt 170 lb (77.1 kg)   SpO2 99%   BMI 30.11 kg/m     Wt Readings from Last 3 Encounters:  01/01/17 170 lb (77.1 kg)  03/24/15 157 lb 9.6 oz (71.5 kg)  02/23/15 158 lb 3.2 oz (71.8 kg)     GEN:  Well nourished, well developed in no acute distress HEENT: Normal NECK: No JVD; No carotid bruits LYMPHATICS: No lymphadenopathy CARDIAC: RRR, no murmurs, rubs, gallops RESPIRATORY:  Clear to auscultation without rales, wheezing or rhonchi  ABDOMEN: Soft, non-tender, non-distended MUSCULOSKELETAL:  No edema; No deformity  SKIN: Warm and  dry NEUROLOGIC:  Alert and oriented x 3 PSYCHIATRIC:  Normal affect    Signed, Norman Herrlich, MD  01/01/2017 5:11 PM    [facility]

## 2017-01-01 NOTE — Patient Instructions (Addendum)
Medication Instructions:  Your physician has recommended you make the following change in your medication:  INCREASE metoprolol to 1.5 tablet (37.5 mg) twice daily.   Labwork: None  Testing/Procedures: You had an EKG today.  Follow-Up: Your physician wants you to follow-up in: 6 months. You will receive a reminder letter in the mail two months in advance. If you don't receive a letter, please call our office to schedule the follow-up appointment.   Any Other Special Instructions Will Be Listed Below (If Applicable).     If you need a refill on your cardiac medications before your next appointment, please call your pharmacy.    1. Avoid all over-the-counter antihistamines except Claritin/Loratadine and Zyrtec/Cetrizine. 2. Avoid all combination including cold sinus allergies flu decongestant and sleep medications 3. You can use Robitussin DM Mucinex and Mucinex DM for cough. 4. can use Tylenol aspirin ibuprofen and naproxen but no combinations such as sleep or sinus.

## 2017-06-12 DIAGNOSIS — D721 Eosinophilia: Secondary | ICD-10-CM | POA: Diagnosis not present

## 2017-11-05 ENCOUNTER — Telehealth: Payer: Self-pay

## 2017-11-05 DIAGNOSIS — I2510 Atherosclerotic heart disease of native coronary artery without angina pectoris: Secondary | ICD-10-CM

## 2017-11-05 MED ORDER — METOPROLOL TARTRATE 25 MG PO TABS: 37.5000 mg | ORAL_TABLET | Freq: Two times a day (BID) | ORAL | 1 refills | Status: DC

## 2017-11-05 NOTE — Telephone Encounter (Signed)
Rx for metoprolol sent to pharmacy as requested.  

## 2017-12-10 NOTE — Progress Notes (Signed)
Cardiology Office Note:    Date:  12/11/2017   ID:  [name], DOB Nov 18, 1960, MRN [id number]  PCP:  Paulina Fusi, MD  Cardiologist:  Norman Herrlich, MD    Referring MD: Paulina Fusi, MD    ASSESSMENT:    1. Premature atrial contraction   2. Ventricular premature beats   3. CAD in native artery   4. Overweight (BMI 25.0-29.9)    PLAN:    In order of problems listed above:  1. Stable continue beta-blocker 2. Stable continue beta-blocker 3. This is an error I am unable to remove it from the visit diagnosis today it is not a new problem list 4. She is very frustrated and I am able to lose weight with diet unable to exercise with foot pain I asked her to discuss with her physician medical treatment including Victoza beneficial in some individuals   Next appointment: 1 year   Medication Adjustments/Labs and Tests Ordered: Current medicines are reviewed at length with the patient today.  Concerns regarding medicines are outlined above.  Orders Placed This Encounter  Procedures  . EKG 12-Lead   Meds ordered this encounter  Medications  . metoprolol tartrate (LOPRESSOR) 25 MG tablet    Sig: Take 1.5 tablets (37.5 mg total) by mouth 2 (two) times daily.    Dispense:  270 tablet    Refill:  3    Patient will need an appt for future refills    Chief Complaint  Patient presents with  . Palpitations    PVC's and APC's    History of Present Illness:    Jessica Francis is a 57 y.o. female with a hx of Palpitations, with frequent PVC's and  APC's   last seen 01/01/17. Compliance with diet, lifestyle and medications: yes He has a good response to beta-blocker and is not having frequent or bothersome palpitation. She is frustrated she is trying to follow a restricted caloric diet in the range of 1300 cal and is not losing weight and cannot exercise because of foot pain.  We discussed pharmacologic modalities and I asked her to follow-up with her primary care  physician Past Medical History:  Diagnosis Date  . Acute cholecystitis with chronic cholecystitis 03/06/2016  . Alopecia   . Asthma   . Biliary dyskinesia 03/02/2016  . Bunion   . CAD in native artery 03/15/2013  . Cardiac dysrhythmia, unspecified   . 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 Cervicalgia   . Cholecystitis, chronic 03/02/2016  . Complication of anesthesia    hard to wake up  . Conductive hearing loss in right ear 04/05/2015  . Coronary artery calcification seen on CT scan 04/26/2015   Overview:  Normal stress MPS at 7.8 mets Sept 2016 EF 60%  . Diarrhea in adult patient 01/25/2015  . Erosive gastritis 03/02/2016  . Headache(784.0)   . 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 History of shingles 01/25/2015   Overview:  In 10/2014; had full therapy; right scapula area  . Hyperlipidemia 04/07/2013  . Left arm pain 02/23/2015  . Lumbosacral spondylosis 04/26/2015  . Lung mass 04/07/2013   Chronic scarring left upper lobe does not represent a malignant process and does NOT NEED  further imaging as has not changed since the year 2000. Pt is aware and Radiology has issued a supplement report indicating no changes.  Overview:  Overview:  Chronic scarring left upper lobe does not represent a  malignant process and does NOT NEED  further imaging as has not changed since the year 2000. Pt is aware and Radiology has issued a supplement report indicating no changes.  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 Mild  persistent asthma without complication 01/25/2015  . Moderate persistent asthma 04/07/2013  . Pain in limb   . Postmenopausal atrophic vaginitis   . Premature atrial contraction 02/23/2015  . Pure hypercholesterolemia   . Unspecified sinusitis (chronic)   . Ventricular premature beats 02/23/2015   Overview:  With hypokalemia    Past Surgical History:  Procedure Laterality Date  . HARDWARE REMOVAL  05/04/2011   Procedure: HARDWARE REMOVAL;  Surgeon: Wyn Forster., MD;  Location: [facility];  Service: Orthopedics;  Laterality: Left;  DVR plate removal left wrist,  . SHOULDER ARTHROSCOPY  1997   lt  . TUBAL LIGATION    . WRIST ARTHROPLASTY  9/12   lt -[facility]    Current Medications: Current Meds  Medication Sig  . albuterol (PROVENTIL HFA;VENTOLIN HFA) 108 (90 BASE) MCG/ACT inhaler Inhale 2 puffs into the lungs every 6 (six) hours as needed for wheezing.  Marland Kitchen aspirin 81 MG tablet Take 81 mg by mouth daily.  . beclomethasone (QVAR) 80 MCG/ACT inhaler Inhale 2 puffs into the lungs daily.   . calcium carbonate (OS-CAL - DOSED IN MG OF ELEMENTAL CALCIUM) 1250 MG tablet Take 1 tablet by mouth daily.    . cholecalciferol (VITAMIN D) 1000 UNITS tablet Take 1,000 Units by mouth daily.    Marland Kitchen estradiol-norethindrone (ACTIVELLA) 1-0.5 MG per tablet Take 1 tablet by mouth daily.    . fluticasone (FLONASE) 50 MCG/ACT nasal spray Place 2 sprays into the nose daily.    . Magnesium 500 MG CAPS Take 500 mg by mouth daily.   . metoprolol tartrate (LOPRESSOR) 25 MG tablet Take 1.5 tablets (37.5 mg total) by mouth 2 (two) times daily.  . Multiple Vitamin (MULTIVITAMIN) capsule Take 1 capsule by mouth daily.    . [DISCONTINUED] metoprolol tartrate (LOPRESSOR) 25 MG tablet Take 1.5 tablets (37.5 mg total) by mouth 2 (two) times daily.     Allergies:   Iodine; Metrizamide; Prednisone; Contrast media [iodinated diagnostic agents]; Doxycycline; and Iohexol   Social History    Socioeconomic History  . Marital status: Married    Spouse name: Not on file  . Number of children: 2  . Years of education: Not on file  . Highest education level: Not on file  Occupational History    Employer: [employer]  Social Needs  . Financial resource strain: Not on file  . Food insecurity:    Worry: Not on file    Inability: Not on file  . Transportation needs:    Medical: Not on file    Non-medical: Not on file  Tobacco Use  . Smoking status: Never Smoker  . Smokeless tobacco: Never Used  Substance and Sexual Activity  . Alcohol use: No    Alcohol/week: 0.0 oz  . Drug use:  No  . Sexual activity: Not on file  Lifestyle  . Physical activity:    Days per week: Not on file    Minutes per session: Not on file  . Stress: Not on file  Relationships  . Social connections:    Talks on phone: Not on file    Gets together: Not on file    Attends religious service: Not on file    Active member of club or organization: Not on file    Attends meetings of clubs or organizations: Not on file    Relationship status: Not on file  Other Topics Concern  . Not on file  Social History Narrative   Lives at home with husband and dog.      Family History: The patient's family history includes Arthritis in her mother; Asthma in her maternal grandfather; CAD (age of onset: 1147) in her brother; CAD (age of onset: 5852) in her sister; CAD (age of onset: 4662) in her mother; Cancer - Lung in her father; Heart disease in her father; Parkinsonism in her mother.  Her brother died post heart surgery in the last year ROS:   Please see the history of present illness.    All other systems reviewed and are negative.  EKGs/Labs/Other Studies Reviewed:    The following studies were reviewed today:  EKG:  EKG ordered today.  The ekg ordered today demonstrates SRTH normal  Recent Labs: No results found for requested labs within last 8760 hours.  Recent Lipid Panel No results found for: CHOL,  TRIG, HDL, CHOLHDL, VLDL, LDLCALC, LDLDIRECT  Physical Exam:    VS:  BP 126/82 (BP Location: Right Arm, Patient Position: Sitting, Cuff Size: Normal)   Pulse 62   Ht 5\' 3"  (1.6 m)   Wt 166 lb (75.3 kg)   SpO2 99%   BMI 29.41 kg/m     Wt Readings from Last 3 Encounters:  12/11/17 166 lb (75.3 kg)  01/01/17 170 lb (77.1 kg)  03/24/15 157 lb 9.6 oz (71.5 kg)     GEN:  Well nourished, well developed in no acute distress HEENT: Normal NECK: No JVD; No carotid bruits LYMPHATICS: No lymphadenopathy CARDIAC: RRR, no murmurs, rubs, gallops RESPIRATORY:  Clear to auscultation without rales, wheezing or rhonchi  ABDOMEN: Soft, non-tender, non-distended MUSCULOSKELETAL:  No edema; No deformity  SKIN: Warm and dry NEUROLOGIC:  Alert and oriented x 3 PSYCHIATRIC:  Normal affect    Signed, Norman HerrlichBrian Carleigh Buccieri, MD  12/11/2017 10:51 AM    [facility]

## 2017-12-11 ENCOUNTER — Encounter: Payer: Self-pay | Admitting: Cardiology

## 2017-12-11 ENCOUNTER — Ambulatory Visit (INDEPENDENT_AMBULATORY_CARE_PROVIDER_SITE_OTHER): Payer: BLUE CROSS/BLUE SHIELD | Admitting: Cardiology

## 2017-12-11 VITALS — BP 126/82 | HR 62 | Ht 63.0 in | Wt 166.0 lb

## 2017-12-11 DIAGNOSIS — I491 Atrial premature depolarization: Secondary | ICD-10-CM

## 2017-12-11 DIAGNOSIS — I493 Ventricular premature depolarization: Secondary | ICD-10-CM | POA: Diagnosis not present

## 2017-12-11 DIAGNOSIS — E663 Overweight: Secondary | ICD-10-CM | POA: Diagnosis not present

## 2017-12-11 DIAGNOSIS — I2510 Atherosclerotic heart disease of native coronary artery without angina pectoris: Secondary | ICD-10-CM | POA: Diagnosis not present

## 2017-12-11 HISTORY — DX: Overweight: E66.3

## 2017-12-11 MED ORDER — METOPROLOL TARTRATE 25 MG PO TABS: 37.5000 mg | ORAL_TABLET | Freq: Two times a day (BID) | ORAL | 3 refills | Status: DC

## 2017-12-11 NOTE — Patient Instructions (Addendum)
Medication Instructions:  Your physician recommends that you continue on your current medications as directed. Please refer to the Current Medication list given to you today.   Labwork: NONE  Testing/Procedures: NONE  Follow-Up: Your physician wants you to follow-up in: 1 year. You will receive a reminder letter in the mail two months in advance. If you don't receive a letter, please call our office to schedule the follow-up appointment.   Any Other Special Instructions Will Be Listed Below (If Applicable).     If you need a refill on your cardiac medications before your next appointment, please call your pharmacy.    Consider victoza for weight loss    Calorie Counting for Weight Loss Calories are units of energy. Your body needs a certain amount of calories from food to keep you going throughout the day. When you eat more calories than your body needs, your body stores the extra calories as fat. When you eat fewer calories than your body needs, your body burns fat to get the energy it needs. Calorie counting means keeping track of how many calories you eat and drink each day. Calorie counting can be helpful if you need to lose weight. If you make sure to eat fewer calories than your body needs, you should lose weight. Ask your health care provider what a healthy weight is for you. For calorie counting to work, you will need to eat the right number of calories in a day in order to lose a healthy amount of weight per week. A dietitian can help you determine how many calories you need in a day and will give you suggestions on how to reach your calorie goal.  A healthy amount of weight to lose per week is usually 1-2 lb (0.5-0.9 kg). This usually means that your daily calorie intake should be reduced by 500-750 calories.  Eating 1,200 - 1,500 calories per day can help most women lose weight.  Eating 1,500 - 1,800 calories per day can help most men lose weight.  What is my plan? My  goal is to have __________ calories per day. If I have this many calories per day, I should lose around __________ pounds per week. What do I need to know about calorie counting? In order to meet your daily calorie goal, you will need to:  Find out how many calories are in each food you would like to eat. Try to do this before you eat.  Decide how much of the food you plan to eat.  Write down what you ate and how many calories it had. Doing this is called keeping a food log.  To successfully lose weight, it is important to balance calorie counting with a healthy lifestyle that includes regular activity. Aim for 150 minutes of moderate exercise (such as walking) or 75 minutes of vigorous exercise (such as running) each week. Where do I find calorie information?  The number of calories in a food can be found on a Nutrition Facts label. If a food does not have a Nutrition Facts label, try to look up the calories online or ask your dietitian for help. Remember that calories are listed per serving. If you choose to have more than one serving of a food, you will have to multiply the calories per serving by the amount of servings you plan to eat. For example, the label on a package of bread might say that a serving size is 1 slice and that there are 90 calories in a  serving. If you eat 1 slice, you will have eaten 90 calories. If you eat 2 slices, you will have eaten 180 calories. How do I keep a food log? Immediately after each meal, record the following information in your food log:  What you ate. Don't forget to include toppings, sauces, and other extras on the food.  How much you ate. This can be measured in cups, ounces, or number of items.  How many calories each food and drink had.  The total number of calories in the meal.  Keep your food log near you, such as in a small notebook in your pocket, or use a mobile app or website. Some programs will calculate calories for you and show you how  many calories you have left for the day to meet your goal. What are some calorie counting tips?  Use your calories on foods and drinks that will fill you up and not leave you hungry: ? Some examples of foods that fill you up are nuts and nut butters, vegetables, lean proteins, and high-fiber foods like whole grains. High-fiber foods are foods with more than 5 g fiber per serving. ? Drinks such as sodas, specialty coffee drinks, alcohol, and juices have a lot of calories, yet do not fill you up.  Eat nutritious foods and avoid empty calories. Empty calories are calories you get from foods or beverages that do not have many vitamins or protein, such as candy, sweets, and soda. It is better to have a nutritious high-calorie food (such as an avocado) than a food with few nutrients (such as a bag of chips).  Know how many calories are in the foods you eat most often. This will help you calculate calorie counts faster.  Pay attention to calories in drinks. Low-calorie drinks include water and unsweetened drinks.  Pay attention to nutrition labels for "low fat" or "fat free" foods. These foods sometimes have the same amount of calories or more calories than the full fat versions. They also often have added sugar, starch, or salt, to make up for flavor that was removed with the fat.  Find a way of tracking calories that works for you. Get creative. Try different apps or programs if writing down calories does not work for you. What are some portion control tips?  Know how many calories are in a serving. This will help you know how many servings of a certain food you can have.  Use a measuring cup to measure serving sizes. You could also try weighing out portions on a kitchen scale. With time, you will be able to estimate serving sizes for some foods.  Take some time to put servings of different foods on your favorite plates, bowls, and cups so you know what a serving looks like.  Try not to eat  straight from a bag or box. Doing this can lead to overeating. Put the amount you would like to eat in a cup or on a plate to make sure you are eating the right portion.  Use smaller plates, glasses, and bowls to prevent overeating.  Try not to multitask (for example, watch TV or use your computer) while eating. If it is time to eat, sit down at a table and enjoy your food. This will help you to know when you are full. It will also help you to be aware of what you are eating and how much you are eating. What are tips for following this plan? Reading food labels  Check  the calorie count compared to the serving size. The serving size may be smaller than what you are used to eating.  Check the source of the calories. Make sure the food you are eating is high in vitamins and protein and low in saturated and trans fats. Shopping  Read nutrition labels while you shop. This will help you make healthy decisions before you decide to purchase your food.  Make a grocery list and stick to it. Cooking  Try to cook your favorite foods in a healthier way. For example, try baking instead of frying.  Use low-fat dairy products. Meal planning  Use more fruits and vegetables. Half of your plate should be fruits and vegetables.  Include lean proteins like poultry and fish. How do I count calories when eating out?  Ask for smaller portion sizes.  Consider sharing an entree and sides instead of getting your own entree.  If you get your own entree, eat only half. Ask for a box at the beginning of your meal and put the rest of your entree in it so you are not tempted to eat it.  If calories are listed on the menu, choose the lower calorie options.  Choose dishes that include vegetables, fruits, whole grains, low-fat dairy products, and lean protein.  Choose items that are boiled, broiled, grilled, or steamed. Stay away from items that are buttered, battered, fried, or served with cream sauce. Items  labeled "crispy" are usually fried, unless stated otherwise.  Choose water, low-fat milk, unsweetened iced tea, or other drinks without added sugar. If you want an alcoholic beverage, choose a lower calorie option such as a glass of wine or light beer.  Ask for dressings, sauces, and syrups on the side. These are usually high in calories, so you should limit the amount you eat.  If you want a salad, choose a garden salad and ask for grilled meats. Avoid extra toppings like bacon, cheese, or fried items. Ask for the dressing on the side, or ask for olive oil and vinegar or lemon to use as dressing.  Estimate how many servings of a food you are given. For example, a serving of cooked rice is  cup or about the size of half a baseball. Knowing serving sizes will help you be aware of how much food you are eating at restaurants. The list below tells you how big or small some common portion sizes are based on everyday objects: ? 1 oz-4 stacked dice. ? 3 oz-1 deck of cards. ? 1 tsp-1 die. ? 1 Tbsp- a ping-pong ball. ? 2 Tbsp-1 ping-pong ball. ?  cup- baseball. ? 1 cup-1 baseball. Summary  Calorie counting means keeping track of how many calories you eat and drink each day. If you eat fewer calories than your body needs, you should lose weight.  A healthy amount of weight to lose per week is usually 1-2 lb (0.5-0.9 kg). This usually means reducing your daily calorie intake by 500-750 calories.  The number of calories in a food can be found on a Nutrition Facts label. If a food does not have a Nutrition Facts label, try to look up the calories online or ask your dietitian for help.  Use your calories on foods and drinks that will fill you up, and not on foods and drinks that will leave you hungry.  Use smaller plates, glasses, and bowls to prevent overeating. This information is not intended to replace advice given to you by your health care provider.  Make sure you discuss any questions you have  with your health care provider. Document Released: 05/15/2005 Document Revised: 04/14/2016 Document Reviewed: 04/14/2016 Elsevier Interactive Patient Education  [employer]2018 [employer] Inc[employer]

## 2018-06-17 DIAGNOSIS — D721 Eosinophilia: Secondary | ICD-10-CM | POA: Diagnosis not present

## 2018-08-21 ENCOUNTER — Other Ambulatory Visit: Payer: Self-pay

## 2018-08-21 ENCOUNTER — Encounter: Payer: Self-pay | Admitting: Allergy and Immunology

## 2018-08-21 ENCOUNTER — Ambulatory Visit (INDEPENDENT_AMBULATORY_CARE_PROVIDER_SITE_OTHER): Payer: BLUE CROSS/BLUE SHIELD | Admitting: Allergy and Immunology

## 2018-08-21 VITALS — BP 132/78 | HR 88 | Temp 98.6°F | Resp 14 | Ht 63.0 in | Wt 170.0 lb

## 2018-08-21 DIAGNOSIS — J3089 Other allergic rhinitis: Secondary | ICD-10-CM | POA: Diagnosis not present

## 2018-08-21 DIAGNOSIS — K219 Gastro-esophageal reflux disease without esophagitis: Secondary | ICD-10-CM | POA: Diagnosis not present

## 2018-08-21 DIAGNOSIS — J4530 Mild persistent asthma, uncomplicated: Secondary | ICD-10-CM | POA: Diagnosis not present

## 2018-08-21 MED ORDER — FAMOTIDINE 40 MG PO TABS: 40.0000 mg | ORAL_TABLET | Freq: Two times a day (BID) | ORAL | 5 refills | Status: DC

## 2018-08-21 MED ORDER — LEVALBUTEROL TARTRATE 45 MCG/ACT IN AERO: INHALATION_SPRAY | RESPIRATORY_TRACT | 1 refills | Status: DC

## 2018-08-21 MED ORDER — MONTELUKAST SODIUM 10 MG PO TABS: ORAL_TABLET | ORAL | 5 refills | Status: DC

## 2018-08-21 NOTE — Patient Instructions (Addendum)
  1.  Allergen avoidance measures  2.  Continue Flovent 110 - 2 inhalations twice a day  3.  Continue Flonase 1-2 sprays each nostril daily  4.  Start montelukast 10 mg daily  5.  Increase famotidine to 40 mg twice a day  6.  Use levo albuterol 2 inhalations every 4-6 hours if needed.  We will need insurance override for diagnosis of tachycardia  7.  Review results of recent chest imaging procedures  8.  Return to clinic in 2 weeks or earlier if problem

## 2018-08-21 NOTE — Progress Notes (Signed)
[place] - [place]   Dear Dr. Tomasa BlaseSchultz,  Thank you for referring Jessica Francis to [facility] [place] on 08/21/2018.   Below is a summation of this patient's evaluation and recommendations.  Thank you for your referral. I will keep you informed about this patient's response to treatment.   If you have any questions please do not hesitate to contact me.   Sincerely,  Jessica PriestEric J. Kozlow, MD Allergy / Immunology [facility]   ______________________________________________________________________    NEW PATIENT NOTE  Referring Provider: Paulina FusiSchultz, Jessica E, MD Primary Provider: Paulina FusiSchultz, Jessica E, MD Date of office visit: 08/21/2018    Subjective:   Chief Complaint:  Jessica Francis (DOB: 05-28-1961) is a 58 y.o. female who presents to the clinic on 08/21/2018 with a chief complaint of Cough .     HPI: Jessica DandyMary presents to this clinic in evaluation of breathing problems.  I had seen her in this clinic almost 10 years ago for issues with asthma.  She really did quite well regarding her asthmatic issues for a decade without any significant issues requiring her to use short acting bronchodilator or systemic steroid or antibiotic while consistently using an inhaled steroid.  She did undergo a course of immunotherapy for about 5 years which appeared to result in very good control of her atopic respiratory issue.  But something changed about a month ago.  She developed this air hunger and feeling as though she needed to breathe deeper to get air along with some slight cough and some slight phlegm.  She had no other associated systemic or constitutional symptoms.  She did not have any fever.  Her phlegm was never an ugly color.  She had no associated upper airway symptoms.  Interestingly, when she exercise she would do quite well without developing this issue.  There was no  obvious provoking factor giving rise to this issue.  She has not really had a significant environmental change that has occurred.  She is not on any new medications.  However, she does have reflux that has became much worse after her cholecystectomy in 2018.  She has been using Pepcid 20 mg daily which does help but she needs to supplement her treatment with Mylanta at nighttime.  She does not consume any caffeine or chocolate or alcohol.  Dr. Tomasa BlaseSchultz has addressed her issue and has apparently performed a CT scan of her chest which did not identify any clot or lung abnormality.  She does have a history of some form of tachycardia with what sounds like PACs or PVCs and she remains away from a short acting bronchodilator because of this issue.  Past Medical History:  Diagnosis Date  . Acute cholecystitis with chronic cholecystitis 03/06/2016  . Alopecia   . Asthma   . Biliary dyskinesia 03/02/2016  . Bunion   . CAD in native artery 03/15/2013  . Cardiac dysrhythmia, unspecified   . 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 Cervicalgia   . Cholecystitis, chronic 03/02/2016  . Complication of anesthesia    hard to wake up  . Conductive hearing loss in right ear 04/05/2015  . Coronary artery calcification seen on CT scan 04/26/2015   Overview:  Normal stress MPS at 7.8 mets Sept 2016 EF 60%  . Diarrhea in adult patient 01/25/2015  . Erosive gastritis 03/02/2016  . Headache(784.0)   .  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 History of shingles 01/25/2015   Overview:  In 10/2014; had full therapy; right scapula area  . Hyperlipidemia 04/07/2013  . Left arm pain 02/23/2015  . Lumbosacral spondylosis 04/26/2015  . Lung mass 04/07/2013   Chronic scarring left upper lobe does not represent a malignant process and does NOT NEED  further imaging as has not changed since the year 2000. Pt is aware and Radiology has issued a supplement report indicating  no changes.  Overview:  Overview:  Chronic scarring left upper lobe does not represent a malignant process and does NOT NEED  further imaging as has not changed since the year 2000. Pt is aware and Radiology has issued a supplement report indicating no changes.  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 Mild persistent asthma without complication 01/25/2015  . Moderate persistent asthma 04/07/2013  . Pain in limb   . Postmenopausal atrophic vaginitis   . Premature atrial contraction 02/23/2015  . Pure hypercholesterolemia   . Unspecified sinusitis (chronic)   . Ventricular premature beats 02/23/2015   Overview:  With hypokalemia    Past Surgical History:  Procedure Laterality Date  . CHOLECYSTECTOMY    . HARDWARE REMOVAL  05/04/2011   Procedure: HARDWARE REMOVAL;  Surgeon: Jessica Francis., MD;  Location: [facility];  Service: Orthopedics;  Laterality: Left;  DVR plate removal left wrist,  . INNER EAR SURGERY     Perforated eardrum  . SHOULDER ARTHROSCOPY  1997   lt  . TUBAL LIGATION    . WRIST ARTHROPLASTY  9/12   lt [facility]    Allergies as of 08/21/2018      Reactions   Iodine Hives   IV, AND BETADINE   Metrizamide Hives   Prednisone Swelling   LOWER EXTREMITY   Contrast Media [iodinated Diagnostic Agents] Hives   Doxycycline Other (See Comments)   dizzy   Iohexol Hives      Medication List      albuterol 108 (90 Base) MCG/ACT inhaler Commonly known as:  PROVENTIL HFA;VENTOLIN HFA Inhale 2 puffs into the lungs every 6  (six) hours as needed for wheezing.   ALPRAZolam 0.25 MG tablet Commonly known as:  XANAX   aspirin 81 MG tablet Take 81 mg by mouth daily.   cholecalciferol 1000 units tablet Commonly known as:  VITAMIN D Take 1,000 Units by mouth daily.   Estradiol-Norethindrone Acet 0.5-0.1 MG tablet Take 1 tablet by mouth daily.   famotidine 20 MG tablet Commonly known as:  PEPCID   Flovent HFA 110 MCG/ACT inhaler Generic drug:  fluticasone Inhale 2 puffs into the lungs 2 (two) times daily.   fluticasone 50 MCG/ACT nasal spray Commonly known as:  FLONASE Place 2 sprays into the nose daily.   Magnesium 500 MG Caps Take 500 mg by mouth daily.   metoprolol tartrate 25 MG tablet Commonly known as:  LOPRESSOR Take 1.5 tablets (37.5 mg total) by mouth 2 (two) times daily.  multivitamin capsule Take 1 capsule by mouth daily.       Review of systems negative except as noted in HPI / PMHx or noted below:  Review of Systems  Constitutional: Negative.   HENT: Negative.   Eyes: Negative.   Respiratory: Negative.   Cardiovascular: Negative.   Gastrointestinal: Negative.   Genitourinary: Negative.   Musculoskeletal: Negative.   Skin: Negative.   Neurological: Negative.   Endo/Heme/Allergies: Negative.   Psychiatric/Behavioral: Negative.     Family History  Problem Relation Age of Onset  . Heart disease Father        RHD, valve replacement  . Cancer - Lung Father   . CAD Mother 66  . Parkinsonism Mother   . Arthritis Mother   . CAD Brother 71  . Diabetes Brother   . CAD Sister 41  . Asthma Maternal Grandfather   . Diabetes Brother     Social History   Socioeconomic History  . Marital status: Married    Spouse name: Not on file  . Number of children: 2  . Years of education: Not on file  . Highest education level: Not on file  Occupational History    Employer: [employer]  Social Needs  . Financial resource strain: Not on file  . Food insecurity:    Worry: Not on  file    Inability: Not on file  . Transportation needs:    Medical: Not on file    Non-medical: Not on file  Tobacco Use  . Smoking status: Never Smoker  . Smokeless tobacco: Never Used  Substance and Sexual Activity  . Alcohol use: No    Alcohol/week: 0.0 standard drinks  . Drug use: No  . Sexual activity: Not on file  Lifestyle  . Physical activity:    Days per week: Not on file    Minutes per session: Not on file  . Stress: Not on file  Relationships  . Social connections:    Talks on phone: Not on file    Gets together: Not on file    Attends religious service: Not on file    Active member of club or organization: Not on file    Attends meetings of clubs or organizations: Not on file    Relationship status: Not on file  . Intimate partner violence:    Fear of current or ex partner: Not on file    Emotionally abused: Not on file    Physically abused: Not on file    Forced sexual activity: Not on file  Other Topics Concern  . Not on file  Social History Narrative   Lives at home with husband and dog.     Environmental and Social history  Lives in a house with a dry environment, a cat located inside the household, carpet in the bedroom, no plastic on the bed, no plastic on the pillow, no smokers located inside the household.  She is an optician  Objective:   Vitals:   08/21/18 1446  BP: 132/78  Pulse: 88  Resp: 14  Temp: 98.6 F (37 C)  SpO2: 98%   Height:  (160 cm) Weight: 170 lb (77.1 kg)  Physical Exam Constitutional:      Appearance: She is not diaphoretic.  HENT:     Head: Normocephalic. No right periorbital erythema or left periorbital erythema.     Right Ear: Tympanic membrane, ear canal and external ear normal.     Left Ear: Tympanic membrane, ear canal and external ear  normal.     Nose: Nose normal. No mucosal edema or rhinorrhea.     Mouth/Throat:     Pharynx: No oropharyngeal exudate.  Eyes:     General: Lids are normal.      Conjunctiva/sclera: Conjunctivae normal.     Pupils: Pupils are equal, round, and reactive to light.  Neck:     Thyroid: No thyromegaly.     Trachea: Trachea normal. No tracheal deviation.  Cardiovascular:     Rate and Rhythm: Normal rate and regular rhythm.     Heart sounds: Normal heart sounds, S1 normal and S2 normal. No murmur.  Pulmonary:     Effort: Pulmonary effort is normal. No respiratory distress.     Breath sounds: No stridor. No wheezing or rales.  Chest:     Chest wall: No tenderness.  Abdominal:     General: There is no distension.     Palpations: Abdomen is soft. There is no mass.     Tenderness: There is no abdominal tenderness. There is no guarding or rebound.  Musculoskeletal:        General: No tenderness.  Lymphadenopathy:     Head:     Right side of head: No tonsillar adenopathy.     Left side of head: No tonsillar adenopathy.     Cervical: No cervical adenopathy.  Skin:    Coloration: Skin is not pale.     Findings: No erythema or rash.     Nails: There is no clubbing.   Neurological:     Mental Status: She is alert.     Diagnostics: Allergy skin tests were performed.  She demonstrated hypersensitivity against house dust mite.  Spirometry was performed and demonstrated an FEV1 of 2.90 @ 116 % of predicted. FEV1/FVC = 0.82  Assessment and Plan:    1. Not well controlled mild persistent asthma   2. Other allergic rhinitis   3. Gastroesophageal reflux disease, esophagitis presence not specified     1.  Allergen avoidance measures  2.  Continue Flovent 110 - 2 inhalations twice a day  3.  Continue Flonase 1-2 sprays each nostril daily  4.  Start montelukast 10 mg daily  5.  Increase famotidine to 40 mg twice a day  6.  Use levo albuterol 2 inhalations every 4-6 hours if needed.  We will need insurance override for diagnosis of tachycardia  7.  Review results of recent chest imaging procedures  8.  Return to clinic in 2 weeks or earlier if  problem  Amazyn does appear to have some atopic disease and we will get her to perform allergen avoidance measures and she will continue on anti-inflammatory agents for her respiratory tract inflammatory condition.  As well, we will increase her therapy for reflux as this appears to be a little more active at this point.  I will see her back in this clinic in 2 weeks to consider further evaluation and treatment based upon her response.  Jessica Priest, MD Allergy / Immunology [facility] [place]

## 2018-08-22 ENCOUNTER — Encounter: Payer: Self-pay | Admitting: Allergy and Immunology

## 2018-08-26 ENCOUNTER — Ambulatory Visit: Payer: Self-pay | Admitting: Allergy and Immunology

## 2018-09-02 ENCOUNTER — Encounter: Payer: Self-pay | Admitting: Allergy and Immunology

## 2018-09-02 ENCOUNTER — Ambulatory Visit (INDEPENDENT_AMBULATORY_CARE_PROVIDER_SITE_OTHER): Payer: BLUE CROSS/BLUE SHIELD | Admitting: Allergy and Immunology

## 2018-09-02 ENCOUNTER — Other Ambulatory Visit: Payer: Self-pay

## 2018-09-02 VITALS — BP 144/62 | HR 56 | Resp 16

## 2018-09-02 DIAGNOSIS — J4530 Mild persistent asthma, uncomplicated: Secondary | ICD-10-CM

## 2018-09-02 DIAGNOSIS — K219 Gastro-esophageal reflux disease without esophagitis: Secondary | ICD-10-CM

## 2018-09-02 DIAGNOSIS — J3089 Other allergic rhinitis: Secondary | ICD-10-CM | POA: Diagnosis not present

## 2018-09-02 NOTE — Patient Instructions (Addendum)
  1.  Allergen avoidance measures - dust mite  2.  Continue Flovent 110 - 2 inhalations twice a day WITH SPACER  3.  Continue Flonase 1-2 sprays each nostril daily  4.  Continue montelukast 10 mg daily  5.  Continue famotidine 40 mg twice a day  6.  Use levo-albuterol 2 inhalations every 4-6 hours if needed.     7. Use the single tablet of Diflucan from Dr. Tomasa Blase  8.  Return to clinic in 12 weeks or earlier if problem

## 2018-09-02 NOTE — Progress Notes (Signed)
[place] - [place] - [place]   Follow-up Note  Referring Provider: Paulina Fusi, MD Primary Provider: Paulina Fusi, MD Date of Office Visit: 09/02/2018  Subjective:   Jessica Francis (DOB: May 25, 1961) is a 58 y.o. female who [facility] in re-evaluation of the following:  HPI: Jessica Francis returns to this clinic in reevaluation of her breathing problems addressed during her initial evaluation 21 August 2018 at which point in time there appeared to be a combination of atopic respiratory disease and reflux.  She is doing better.  She likes the effect that she receives from montelukast as her air hunger and shortness of breath appeared to have resolved.  She has unfortunately developed thrush with using Flovent as she had to change from Qvar to Flovent because of an insurance issue.  She is not using a spacer.  Rarely does she use a short acting bronchodilator.  It appears that montelukast at 10 mg might a given rise to a fast heart rate and she is now using a half a tablet with good effect.  She has had very little issues with reflux at this point in time.  She is working on performing house dust avoidance measures.  Allergies as of 09/02/2018      Reactions   Iodine Hives   IV, AND BETADINE   Metrizamide Hives   Prednisone Swelling   LOWER EXTREMITY   Contrast Media [iodinated Diagnostic Agents] Hives   Doxycycline Other (See Comments)   dizzy   Iohexol Hives      Medication List      albuterol 108 (90 Base) MCG/ACT inhaler Commonly known as:  PROVENTIL HFA;VENTOLIN HFA Inhale 2 puffs into the lungs every 6 (six) hours as needed for wheezing.   ALPRAZolam 0.25 MG tablet Commonly known as:  XANAX   aspirin 81 MG tablet Take 81 mg by mouth daily.   cholecalciferol 1000 units tablet Commonly known as:  VITAMIN D Take 1,000 Units by mouth daily.   Estradiol-Norethindrone Acet 0.5-0.1 MG tablet  Take 1 tablet by mouth daily.   famotidine 40 MG tablet Commonly known as:  PEPCID Take 1 tablet (40 mg total) by mouth 2 (two) times daily.   Flovent HFA 110 MCG/ACT inhaler Generic drug:  fluticasone Inhale 2 puffs into the lungs 2 (two) times daily.   fluticasone 50 MCG/ACT nasal spray Commonly known as:  FLONASE Place 2 sprays into the nose daily.   levalbuterol 45 MCG/ACT inhaler Commonly known as:  XOPENEX HFA Can inhale two puffs every four to six hours as needed for cough or wheeze.   Magnesium 500 MG Caps Take 500 mg by mouth daily.   metoprolol tartrate 25 MG tablet Commonly known as:  LOPRESSOR Take 1.5 tablets (37.5 mg total) by mouth 2 (two) times daily.   montelukast 10 MG tablet Commonly known as:  SINGULAIR Take one tablet by mouth at bedtime.   multivitamin capsule Take 1 capsule by mouth daily.       Past Medical History:  Diagnosis Date  . Acute cholecystitis with chronic cholecystitis 03/06/2016  . Alopecia   . Asthma   . Biliary dyskinesia 03/02/2016  . Bunion   . CAD in native artery 03/15/2013  . Cardiac dysrhythmia, unspecified   . 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 Cervicalgia   . Cholecystitis, chronic 03/02/2016  . Complication of anesthesia    hard to  wake up  . Conductive hearing loss in right ear 04/05/2015  . Coronary artery calcification seen on CT scan 04/26/2015   Overview:  Normal stress MPS at 7.8 mets Sept 2016 EF 60%  . Diarrhea in adult patient 01/25/2015  . Erosive gastritis 03/02/2016  . Headache(784.0)   . 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 History of shingles 01/25/2015   Overview:  In 10/2014; had full therapy; right scapula area  . Hyperlipidemia 04/07/2013  . Left arm pain 02/23/2015  . Lumbosacral spondylosis 04/26/2015  . Lung mass 04/07/2013   Chronic scarring left upper lobe does not represent a malignant process and does NOT NEED  further imaging as  has not changed since the year 2000. Pt is aware and Radiology has issued a supplement report indicating no changes.  Overview:  Overview:  Chronic scarring left upper lobe does not represent a malignant process and does NOT NEED  further imaging as has not changed since the year 2000. Pt is aware and Radiology has issued a supplement report indicating no changes.  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 Mild persistent asthma without complication 01/25/2015  . Moderate persistent asthma 04/07/2013  . Pain in limb   . Postmenopausal atrophic vaginitis   . Premature atrial contraction 02/23/2015  . Pure hypercholesterolemia   . Unspecified sinusitis (chronic)   . Ventricular premature beats 02/23/2015   Overview:  With hypokalemia    Past Surgical History:  Procedure Laterality Date  . CHOLECYSTECTOMY    . HARDWARE REMOVAL  05/04/2011   Procedure: HARDWARE REMOVAL;  Surgeon: Wyn Forster., MD;  Location: [facility];  Service: Orthopedics;  Laterality: Left;  DVR plate removal left wrist,  . INNER EAR SURGERY     Perforated eardrum  . SHOULDER ARTHROSCOPY  1997   lt  . TUBAL LIGATION    . WRIST ARTHROPLASTY  9/12   lt -[facility]    Review of systems negative except as noted in HPI / PMHx or noted below:  Review of Systems  Constitutional: Negative.   HENT: Negative.   Eyes: Negative.   Respiratory: Negative.   Cardiovascular: Negative.   Gastrointestinal: Negative.   Genitourinary: Negative.   Musculoskeletal: Negative.    Skin: Negative.   Neurological: Negative.   Endo/Heme/Allergies: Negative.   Psychiatric/Behavioral: Negative.      Objective:   Vitals:   09/02/18 0928  BP: (!) 144/62  Pulse: (!) 56  Resp: 16  SpO2: 98%          Physical Exam Constitutional:      Appearance: She is not diaphoretic.  HENT:     Head: Normocephalic.     Right Ear: Ear canal and external ear normal. Tympanic membrane is scarred.     Left Ear: Ear canal and external ear normal. Tympanic membrane is scarred.     Nose: Nose normal. No mucosal edema or rhinorrhea.     Mouth/Throat:     Pharynx: Uvula midline. Oropharyngeal  exudate (Thrush) present.  Eyes:     Conjunctiva/sclera: Conjunctivae normal.  Neck:     Thyroid: No thyromegaly.     Trachea: Trachea normal. No tracheal tenderness or tracheal deviation.  Cardiovascular:     Rate and Rhythm: Normal rate and regular rhythm.     Heart sounds: Normal heart sounds, S1 normal and S2 normal. No murmur.  Pulmonary:     Effort: No respiratory distress.     Breath sounds: Normal breath sounds. No stridor. No wheezing or rales.  Lymphadenopathy:     Head:     Right side of head: No tonsillar adenopathy.     Left side of head: No tonsillar adenopathy.     Cervical: No cervical adenopathy.  Skin:    Findings: No erythema or rash.     Nails: There is no clubbing.   Neurological:     Mental Status: She is alert.     Diagnostics:    Spirometry was performed and demonstrated an FEV1 of 2.77 at 110 % of predicted.  The patient had an Asthma Control Test with the following results:  .    Assessment and Plan:   1. Asthma, well controlled, mild persistent   2. Other allergic rhinitis   3. Gastroesophageal reflux disease, esophagitis presence not specified     1.  Allergen avoidance measures - dust mite  2.  Continue Flovent 110 - 2 inhalations twice a day WITH SPACER  3.  Continue Flonase 1-2 sprays each nostril daily  4.  Continue montelukast 10  mg daily  5.  Continue famotidine 40 mg twice a day  6.  Use levo-albuterol 2 inhalations every 4-6 hours if needed.     7. Use the single tablet of Diflucan from Dr. Tomasa BlaseSchultz  8.  Return to clinic in 12 weeks or earlier if problem  I think Jessica Francis is doing better at this point and we will continue to have her use a combination of anti-inflammatory medications for her airway and therapy directed against reflux as noted above.  We have provided her a spacer today as she did develop thrush while utilizing her Flovent.  Assuming she does well I will see her back in this clinic in 12 weeks or earlier if there is a problem.  Laurette SchimkeEric Jhanvi Drakeford, MD Allergy / Immunology [facility]

## 2018-09-03 ENCOUNTER — Encounter: Payer: Self-pay | Admitting: Allergy and Immunology

## 2018-10-18 DIAGNOSIS — G935 Compression of brain: Secondary | ICD-10-CM | POA: Insufficient documentation

## 2018-11-25 ENCOUNTER — Ambulatory Visit (INDEPENDENT_AMBULATORY_CARE_PROVIDER_SITE_OTHER): Payer: BC Managed Care – PPO | Admitting: Allergy and Immunology

## 2018-11-25 ENCOUNTER — Encounter: Payer: Self-pay | Admitting: Allergy and Immunology

## 2018-11-25 ENCOUNTER — Other Ambulatory Visit: Payer: Self-pay

## 2018-11-25 VITALS — BP 140/90 | HR 68 | Temp 98.3°F | Resp 16

## 2018-11-25 DIAGNOSIS — J4530 Mild persistent asthma, uncomplicated: Secondary | ICD-10-CM

## 2018-11-25 DIAGNOSIS — J3089 Other allergic rhinitis: Secondary | ICD-10-CM

## 2018-11-25 DIAGNOSIS — K219 Gastro-esophageal reflux disease without esophagitis: Secondary | ICD-10-CM

## 2018-11-25 NOTE — Patient Instructions (Addendum)
  1.  Allergen avoidance measures - dust mite  2.  Continue Asmanex 100- 2 inhalations twice a day with spacer  3.  Continue Flonase 1-2 sprays each nostril daily  4.  Continue montelukast 10 mg daily  5.  Continue Nexium 40 mg 1 time per day  6.  Use levo-albuterol 2 inhalations every 4-6 hours if needed.     7. Return to clinic in 6 months or earlier if problem  8. Obtain fall flu vaccine

## 2018-11-25 NOTE — Progress Notes (Signed)
[place]   Follow-up Note  Referring Provider: Paulina FusiSchultz, Douglas E, MD Primary Provider: Paulina FusiSchultz, Douglas E, MD Date of Office Visit: 11/25/2018  Subjective:   Jessica Francis (DOB: [birth year]) is a 58 y.o. female who [facility]2018 in re-evaluation of the following:  HPI: Jessica Francis returns to this clinic in reevaluation of asthma and allergic rhinitis and LPR.  Her last visit to this clinic was 02 November 2018.  At that point in time she was having some problems with Flovent secondary to thrush and we gave her a spacer.  Unfortunately, she continued to have problems with thrush and she has converted back to using her Qvar RediHaler.  Unfortunately, her insurance does not pay for Qvar RediHaler and she is paying $180 a month for this medication.  She has had very little issues with either her nose or her chest while utilizing an inhaled and nasal steroid on a regular basis and she rarely uses a short acting bronchodilator and has had minimal if any bronchospastic symptoms or upper airway symptoms.  She is having no issues with her throat.  Apparently she was having nausea and abdominal pain and she was administered Nexium to replace her famotidine and visited with Dr. Jennye Francis who performed an upper endoscopy last week which apparently was normal and then she ended up in the emergency room with continued pain and a CT scan of her abdomen identified a apparent urinary tract infection and she was treated with antibiotics and most of her pain has resolved.  Allergies as of 11/25/2018      Reactions   Iodine Hives   IV, AND BETADINE   Metrizamide Hives   Prednisone Swelling   LOWER EXTREMITY   Contrast Media [iodinated Diagnostic Agents] Hives   Doxycycline Other (See Comments)   dizzy   Iohexol Hives      Medication List    albuterol 108 (90 Base) MCG/ACT inhaler Commonly known as: VENTOLIN HFA  Inhale 2 puffs into the lungs every 6 (six) hours as needed for wheezing.   aspirin 81 MG tablet Take 81 mg by mouth daily.   cholecalciferol 1000 units tablet Commonly known as: VITAMIN D Take 1,000 Units by mouth daily.   esomeprazole 40 MG capsule Commonly known as: NEXIUM TAKE 1 CAPSULE BY MOUTH ONCE DAILY IN THE MORNING 30 MINUTES BEFORE BREAKFAST   Estradiol-Norethindrone Acet 0.5-0.1 MG tablet Take 1 tablet by mouth daily.   fluticasone 50 MCG/ACT nasal spray Commonly known as: FLONASE Place 2 sprays into the nose daily.   levalbuterol 45 MCG/ACT inhaler Commonly known as: XOPENEX HFA Can inhale two puffs every four to six hours as needed for cough or wheeze.   Magnesium 500 MG Caps Take 500 mg by mouth daily.   metoprolol tartrate 25 MG tablet Commonly known as: LOPRESSOR Take 1.5 tablets (37.5 mg total) by mouth 2 (two) times daily.   montelukast 10 MG tablet Commonly known as: SINGULAIR Take one tablet by mouth at bedtime.   multivitamin capsule Take 1 capsule by mouth daily.   Qvar RediHaler 80 MCG/ACT inhaler Generic drug: beclomethasone INHALE 2 PUFFS BY MOUTH TWICE DAILY. RINSE MOUTH AFTER USE       Past Medical History:  Diagnosis Date  . Acute cholecystitis with chronic cholecystitis 03/06/2016  . Alopecia   . Asthma   . Biliary dyskinesia 03/02/2016  . Bunion   . CAD in native  artery 03/15/2013  . Cardiac dysrhythmia, unspecified   . 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 Cervicalgia   . Cholecystitis, chronic 03/02/2016  . Complication of anesthesia    hard to wake up  . Conductive hearing loss in right ear 04/05/2015  . Coronary artery calcification seen on CT scan 04/26/2015   Overview:  Normal stress MPS at 7.8 mets Sept 2016 EF 60%  . Diarrhea in adult patient 01/25/2015  . Erosive gastritis 03/02/2016  . Headache(784.0)   . 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 History of  shingles 01/25/2015   Overview:  In 10/2014; had full therapy; right scapula area  . Hyperlipidemia 04/07/2013  . Left arm pain 02/23/2015  . Lumbosacral spondylosis 04/26/2015  . Lung mass 04/07/2013   Chronic scarring left upper lobe does not represent a malignant process and does NOT NEED  further imaging as has not changed since the year 2000. Pt is aware and Radiology has issued a supplement report indicating no changes.  Overview:  Overview:  Chronic scarring left upper lobe does not represent a malignant process and does NOT NEED  further imaging as has not changed since the year 2000. Pt is aware and Radiology has issued a supplement report indicating no changes.  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 Mild persistent asthma without complication 01/25/2015  . Moderate persistent asthma 04/07/2013  . Pain in limb   . Postmenopausal atrophic vaginitis   . Premature atrial contraction 02/23/2015  . Pure hypercholesterolemia   . Unspecified sinusitis (chronic)   . Ventricular premature beats 02/23/2015   Overview:  With hypokalemia    Past Surgical History:  Procedure Laterality Date  . CHOLECYSTECTOMY    . HARDWARE REMOVAL  05/04/2011   Procedure: HARDWARE REMOVAL;  Surgeon: Wyn Forsterobert V Sypher Jr., MD;  Location: [facility];  Service: Orthopedics;  Laterality: Left;  DVR plate removal left wrist,  . INNER EAR SURGERY     Perforated eardrum  . SHOULDER ARTHROSCOPY  1997   lt  . TUBAL LIGATION    . WRIST ARTHROPLASTY  9/12   lt -[facility]     Review of systems negative except as noted in HPI / PMHx or noted below:  Review of Systems  Constitutional: Negative.   HENT: Negative.   Eyes: Negative.   Respiratory: Negative.   Cardiovascular: Negative.   Gastrointestinal: Negative.   Genitourinary: Negative.   Musculoskeletal: Negative.   Skin: Negative.   Neurological: Negative.   Endo/Heme/Allergies: Negative.   Psychiatric/Behavioral: Negative.      Objective:   Vitals:   11/25/18 1025  BP: 140/90  Pulse: 68  Resp: 16  Temp: 98.3 F (36.8 C)  SpO2: 98%          Physical Exam Constitutional:      Appearance: She is not diaphoretic.  HENT:     Head: Normocephalic.     Right Ear: Ear canal and external ear normal. Tympanic membrane is  scarred.     Left Ear: Ear canal and external ear normal. Tympanic membrane is scarred.     Nose: Nose normal. No mucosal edema or rhinorrhea.     Mouth/Throat:     Pharynx: Uvula midline. No oropharyngeal exudate.  Eyes:     Conjunctiva/sclera: Conjunctivae normal.  Neck:     Thyroid: No thyromegaly.     Trachea: Trachea normal. No tracheal tenderness or tracheal deviation.  Cardiovascular:     Rate and Rhythm: Normal rate and regular rhythm.     Heart sounds: Normal heart sounds, S1 normal and S2 normal. No murmur.  Pulmonary:     Effort: No respiratory distress.     Breath sounds: Normal breath sounds. No stridor. No wheezing or rales.  Lymphadenopathy:     Head:     Right side of head: No tonsillar adenopathy.     Left side of head: No tonsillar adenopathy.     Cervical: No cervical adenopathy.  Skin:    Findings: No erythema or rash.     Nails: There is no clubbing.   Neurological:     Mental Status: She is alert.     Diagnostics:    Spirometry was performed and demonstrated an FEV1 of 2.84 at 113 % of predicted.  Assessment and Plan:   1. Asthma, well controlled, mild persistent   2. Other allergic rhinitis   3. Gastroesophageal reflux disease,  esophagitis presence not specified     1.  Allergen avoidance measures - dust mite  2.  Continue Asmanex 100- 2 inhalations twice a day with spacer  3.  Continue Flonase 1-2 sprays each nostril daily  4.  Continue montelukast 10 mg daily  5.  Continue Nexium 40 mg 1 time per day  6.  Use levo-albuterol 2 inhalations every 4-6 hours if needed.     7. Return to clinic in 6 months or earlier if problem  8. Obtain fall flu vaccine  Shalonda does very well when using anti-inflammatory agents for her airway but she has been having some side effects from the use of these agents and difficulty with insurance coverage and I will now have her try a sample of Asmanex to replace her Flovent and we will see how things go over the course of the next month.  Then we will try to get approval for this agent rather than Flovent from her insurance company.  She will continue on the plan noted above and I will see her back in this clinic in 6 months or earlier if there is a problem.  Allena Katz, MD Allergy / Immunology [place]

## 2018-11-26 ENCOUNTER — Encounter: Payer: Self-pay | Admitting: Allergy and Immunology

## 2018-12-24 ENCOUNTER — Telehealth: Payer: Self-pay | Admitting: *Deleted

## 2018-12-24 ENCOUNTER — Ambulatory Visit: Payer: BLUE CROSS/BLUE SHIELD | Admitting: Cardiology

## 2018-12-24 DIAGNOSIS — I2510 Atherosclerotic heart disease of native coronary artery without angina pectoris: Secondary | ICD-10-CM

## 2018-12-24 MED ORDER — METOPROLOL TARTRATE 25 MG PO TABS: 37.5000 mg | ORAL_TABLET | Freq: Two times a day (BID) | ORAL | 0 refills | Status: DC

## 2018-12-24 NOTE — Telephone Encounter (Signed)
Rx refill sent to pharmacy.  *STAT* If patient is at the pharmacy, call can be transferred to refill team.   1. Which medications need to be refilled? (please list name of each medication and dose if known) Metoprolol 25 mg Take 1.5 bid  2. Which pharmacy/location (including street and city if local pharmacy) is medication to be sent to?[place]  3. Do they need a 30 day or 90 day supply? [place]

## 2019-01-13 NOTE — Progress Notes (Signed)
Cardiology Office Note:    Date:  01/14/2019   ID:  Jessica RaiderMary D Innis, DOB 1961/03/11, MRN [id number]  PCP:  Paulina FusiSchultz, Douglas E, MD  Cardiologist:  Norman HerrlichBrian Adianna Darwin, MD    Referring MD: Paulina FusiSchultz, Douglas E, MD    ASSESSMENT:    1. Ventricular premature beats   2. Premature atrial contraction   3. Other hyperlipidemia    PLAN:    In order of problems listed above:  1. Good response to beta-blocker no severe sustained or repetitive arrhythmia.  She will continue her current metoprolol although avoid over-the-counter proarrhythmic drugs and I will plan to see him in 1 year. 2. Continue non-statin Zetia she tolerates 2 days a week if needed PCSK9 would be a good idea with coronary artery calcification   Next appointment: 1 year   Medication Adjustments/Labs and Tests Ordered: Current medicines are reviewed at length with the patient today.  Concerns regarding medicines are outlined above.  No orders of the defined types were placed in this encounter.  No orders of the defined types were placed in this encounter.   Chief Complaint  Patient presents with  . Follow-up    for PVC's and APC's    History of Present Illness:    Jessica Francis is a 58 y.o. female with a hx of palpitations with frequent PVC's and PAC's, asthma, GERD last seen 12/11/17. Compliance with diet, lifestyle and medications: Yes  Her arrhythmia is under good control no severe prolonged or frequent arrhythmia tolerates beta-blocker.  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  No chest pain shortness of breath or syncope. Past Medical History:  Diagnosis Date  . Acute cholecystitis with chronic cholecystitis 03/06/2016  . Alopecia   . Asthma   . Biliary dyskinesia 03/02/2016  . Bunion   . CAD in native artery 03/15/2013  . Cardiac dysrhythmia, unspecified   . 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 Cervicalgia   . Cholecystitis, chronic 03/02/2016  . Complication of anesthesia    hard to wake up  . Conductive hearing loss in right ear 04/05/2015  . Coronary artery calcification seen on CT scan 04/26/2015   Overview:  Normal stress MPS at 7.8 mets Sept 2016 EF 60%  . Diarrhea in adult patient 01/25/2015  . Erosive gastritis 03/02/2016  . Headache(784.0)   . 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 History of shingles 01/25/2015   Overview:  In 10/2014; had full therapy; right scapula area  . Hyperlipidemia 04/07/2013  . Left arm pain 02/23/2015  . Lumbosacral spondylosis 04/26/2015  . Lung mass 04/07/2013   Chronic scarring left upper lobe does not represent a malignant process and does NOT NEED  further imaging as has not changed since the year 2000. Pt is aware and Radiology has issued a supplement report indicating no changes.  Overview:  Overview:  Chronic scarring left upper lobe does not represent a malignant process and does NOT NEED  further imaging as has not changed since the year 2000. Pt is aware and Radiology has issued a supplement report indicating no changes.  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 Mild persistent asthma without complication 11/23/3149  . Moderate persistent asthma 04/07/2013  . Pain in limb   . Postmenopausal atrophic vaginitis   . Premature atrial contraction 02/23/2015  . Pure hypercholesterolemia   . Unspecified sinusitis (chronic)   . Ventricular  premature beats 02/23/2015   Overview:  With hypokalemia    Past Surgical History:  Procedure Laterality Date  . CHOLECYSTECTOMY    . HARDWARE REMOVAL  05/04/2011   Procedure: HARDWARE REMOVAL;  Surgeon: Cammie Sickle., MD;  Location: [facility];  Service: Orthopedics;  Laterality: Left;  DVR plate removal left wrist,  . INNER EAR SURGERY     Perforated eardrum  . SHOULDER ARTHROSCOPY  1997   lt  . TUBAL LIGATION    . WRIST ARTHROPLASTY  9/12   lt -[facility]    Current Medications: Current Meds  Medication Sig  . albuterol (PROVENTIL HFA;VENTOLIN HFA) 108 (90 BASE) MCG/ACT inhaler Inhale 2 puffs into the lungs every 6 (six) hours as needed for wheezing.  Marland Kitchen aspirin 81 MG tablet Take 81 mg by mouth daily.  . cholecalciferol (VITAMIN D) 1000 UNITS tablet Take 1,000 Units by mouth daily.    Marland Kitchen esomeprazole (NEXIUM) 40 MG capsule TAKE 1 CAPSULE BY MOUTH ONCE DAILY IN THE MORNING 30 MINUTES BEFORE BREAKFAST  . fluticasone (FLONASE) 50 MCG/ACT nasal spray Place 2 sprays into the nose daily.    . Magnesium 500 MG CAPS Take 500 mg by mouth daily.   . metoprolol tartrate (LOPRESSOR) 25 MG tablet Take 1.5 tablets (37.5 mg total) by mouth 2 (two) times daily.  . montelukast (SINGULAIR) 10 MG tablet Take one tablet by mouth at bedtime.  . Multiple Vitamin (MULTIVITAMIN) capsule Take 1 capsule by mouth daily.    Marland Kitchen QVAR REDIHALER 80 MCG/ACT inhaler INHALE 2 PUFFS BY MOUTH TWICE DAILY. RINSE MOUTH AFTER USE     Allergies:   Iodine, Metrizamide, Prednisone, Contrast media [iodinated diagnostic agents], Doxycycline, and Iohexol   Social History   Socioeconomic History  . Marital status: Married    Spouse name: Not on file  . Number of children: 2  . Years of education: Not on file  . Highest education level: Not on file  Occupational History    Employer: [employer]  . Financial resource strain: Not on file  . Food insecurity    Worry: Not on file     Inability: Not on file  . Transportation needs    Medical: Not on file    Non-medical: Not on file  Tobacco Use  . Smoking status: Never Smoker  . Smokeless tobacco: Never Used  Substance and Sexual Activity  . Alcohol use: No    Alcohol/week: 0.0 standard drinks  . Drug use: No  . Sexual activity: Not on file  Lifestyle  . Physical activity    Days per week: Not on file    Minutes per session: Not on file  . Stress: Not on file  Relationships  . Social Herbalist on phone: Not on file    Gets together: Not on file    Attends religious service: Not on file    Active member of club or  organization: Not on file    Attends meetings of clubs or organizations: Not on file    Relationship status: Not on file  Other Topics Concern  . Not on file  Social History Narrative   Lives at home with husband and dog.      Family History: The patient's family history includes Arthritis in her mother; Asthma in her maternal grandfather; CAD (age of onset: 6547) in her brother; CAD (age of onset: 6052) in her sister; CAD (age of onset: 8662) in her mother; Cancer - Lung in her father; Diabetes in her brother and brother; Heart disease in her father; Parkinsonism in her mother. ROS:   Please see the history of present illness.    All other systems reviewed and are negative.  EKGs/Labs/Other Studies Reviewed:    The following studies were reviewed today:  EKG:  EKG ordered today and personally reviewed.  The ekg ordered today demonstrates sinus rhythm normal no PVCs or APCs normal QT interval  Recent Labs: Requested from her PCP  Physical Exam:    VS:  BP 126/80 (BP Location: Right Arm, Patient Position: Sitting, Cuff Size: Large)   Pulse 72   Ht 5\' 3"  (1.6 m)   Wt 168 lb (76.2 kg)   SpO2 98%   BMI 29.76 kg/m     Wt Readings from Last 3 Encounters:  01/14/19 168 lb (76.2 kg)  08/21/18 170 lb (77.1 kg)  12/11/17 166 lb (75.3 kg)     GEN:  Well nourished, well developed in  no acute distress HEENT: Normal NECK: No JVD; No carotid bruits LYMPHATICS: No lymphadenopathy CARDIAC: RRR, no murmurs, rubs, gallops RESPIRATORY:  Clear to auscultation without rales, wheezing or rhonchi  ABDOMEN: Soft, non-tender, non-distended MUSCULOSKELETAL:  No edema; No deformity  SKIN: Warm and dry NEUROLOGIC:  Alert and oriented x 3 PSYCHIATRIC:  Normal affect    Signed, Norman HerrlichBrian Keleigh Kazee, MD  01/14/2019 12:09 PM    [facility]

## 2019-01-14 ENCOUNTER — Encounter: Payer: Self-pay | Admitting: Cardiology

## 2019-01-14 ENCOUNTER — Ambulatory Visit (INDEPENDENT_AMBULATORY_CARE_PROVIDER_SITE_OTHER): Payer: BLUE CROSS/BLUE SHIELD | Admitting: Cardiology

## 2019-01-14 ENCOUNTER — Other Ambulatory Visit: Payer: Self-pay

## 2019-01-14 VITALS — BP 126/80 | HR 72 | Ht 63.0 in | Wt 168.0 lb

## 2019-01-14 DIAGNOSIS — I491 Atrial premature depolarization: Secondary | ICD-10-CM

## 2019-01-14 DIAGNOSIS — E7849 Other hyperlipidemia: Secondary | ICD-10-CM | POA: Diagnosis not present

## 2019-01-14 DIAGNOSIS — I2510 Atherosclerotic heart disease of native coronary artery without angina pectoris: Secondary | ICD-10-CM

## 2019-01-14 DIAGNOSIS — I493 Ventricular premature depolarization: Secondary | ICD-10-CM

## 2019-01-14 MED ORDER — METOPROLOL TARTRATE 25 MG PO TABS: 37.5000 mg | ORAL_TABLET | Freq: Two times a day (BID) | ORAL | 3 refills | Status: DC

## 2019-01-14 NOTE — Patient Instructions (Signed)

## 2019-02-18 DIAGNOSIS — R002 Palpitations: Secondary | ICD-10-CM | POA: Insufficient documentation

## 2019-02-18 DIAGNOSIS — J45909 Unspecified asthma, uncomplicated: Secondary | ICD-10-CM | POA: Insufficient documentation

## 2019-03-06 ENCOUNTER — Telehealth: Payer: Self-pay | Admitting: Cardiology

## 2019-03-06 NOTE — Telephone Encounter (Signed)
Patient reports having intermittent left arm pain for the past two weeks. The pain has worsened in the past 3-4 days so patient is concerned. She has tried taking aleve with no relief. Patient states she had a previous injury to her left wrist that required surgery but she has not experienced any pain since then until now.   Reviewed with Dr. Bettina Gavia who advised patient be scheduled for an appointment for further evaluation. Patient has been scheduled to see Dr. Harriet Masson in the [facility] office tomorrow, 03/07/2019, at 9:20 am. Advised patient to go to the nearest emergency department to be evaluated if symptoms worsen before her appointment. Patient is agreeable verbalized understanding. No further questions.

## 2019-03-06 NOTE — Progress Notes (Signed)
Cardiology Office Note:    Date:  03/08/2019   ID:  Jessica Francis, DOB 26-Jan-1961, MRN [id number]  PCP:  Nicoletta Dress, MD  Cardiologist:  No primary care provider on file.  Electrophysiologist:  None   Referring MD: Nicoletta Dress, MD   Follow up for new chest pain.  History of Present Illness:    Jessica Francis is a 58 y.o. female with a hx of palpitations with frequent PVC's and PAC's, asthma, GERD last seen 01/04/2019 by Dr. Bettina Gavia.  She reports that she has been expressing chest pain-pressure for about a week and a half now.  She noticed that his throat started when she was walking at work when she had this incident substernal chest pressure and almost made her feel like she was going to blackout.  Ever since that time she has had intermittent pressures across to her left arm.  States that the left arm pain is more bothersome.  She is very worried because all of her siblings (2 brothers and a sister) prior to the heart attacks or had arm pain. She denies any shortness of breath, nausea, headaches or dizziness.  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   Cervicalgia    Cholecystitis, chronic 01/02/5783   Complication of anesthesia    hard to wake up   Conductive hearing loss in right ear 04/05/2015   Coronary artery calcification seen on CT scan 04/26/2015   Overview:  Normal stress MPS at 7.8 mets Sept 2016 EF 60%   Diarrhea in adult patient 01/25/2015   Erosive gastritis 03/02/2016   Headache(784.0)    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 Pt is aware and Radiology has issued a supplement report indicating no changes.  Overview:  Overview:  Chronic scarring left upper lobe does not represent a malignant process and does NOT NEED  further imaging as has not changed since the year 2000. Pt is aware and Radiology has issued a supplement report indicating no changes.  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 represents the nodule is described on July 2014 CT scan of chest After reviewing prior x-ray studies going back to the year 2000 this area of scarring has not changed in a 14 year interval of time  Plan No additional imaging studies are necessary Pulmonary followup is as needed I will have radiology compare the most recent CT scan of chest with CT scan of neck which shows the upper chest area from 2009   Mild persistent asthma without complication 6/96/2952   Moderate persistent asthma 04/07/2013   Pain in limb    Postmenopausal atrophic vaginitis    Premature atrial contraction 02/23/2015   Pure hypercholesterolemia    Unspecified sinusitis (chronic)    Ventricular premature beats 02/23/2015   Overview:  With hypokalemia    Past Surgical History:  Procedure Laterality Date   CHOLECYSTECTOMY     HARDWARE REMOVAL  05/04/2011   Procedure: HARDWARE REMOVAL;  Surgeon: Wyn Forster., MD;  Location: [facility];  Service: Orthopedics;  Laterality: Left;  DVR plate removal left wrist,   INNER EAR SURGERY     Perforated eardrum   SHOULDER ARTHROSCOPY  1997   lt   TUBAL LIGATION     WRIST ARTHROPLASTY  9/12   lt -[facility]    Current Medications: Current Meds  Medication Sig   albuterol (PROVENTIL HFA;VENTOLIN HFA) 108 (90 BASE)  MCG/ACT inhaler Inhale 2 puffs into the lungs every 6 (six) hours as needed for wheezing.   aspirin 81 MG tablet Take 81 mg by mouth daily.   cholecalciferol (VITAMIN D) 1000 UNITS tablet Take 1,000 Units by mouth daily.     esomeprazole (NEXIUM) 40 MG capsule TAKE 1 CAPSULE BY MOUTH ONCE DAILY IN THE MORNING 30 MINUTES BEFORE BREAKFAST   estradiol (ESTRACE) 0.1 MG/GM vaginal cream INSERT 0.5 GRAM INTO VAGINA 3 TIMES A DAY   fluticasone (FLONASE) 50 MCG/ACT nasal spray Place 2 sprays into the nose daily.     Magnesium 500 MG CAPS Take 500 mg by mouth daily.    metoprolol tartrate (LOPRESSOR) 25 MG tablet Take 1.5 tablets (37.5 mg total) by mouth 2 (two) times daily.   montelukast (SINGULAIR) 10 MG tablet Take one tablet by mouth at bedtime.   Multiple Vitamin (MULTIVITAMIN) capsule Take 1 capsule by mouth daily.     QVAR REDIHALER 80 MCG/ACT inhaler INHALE 2 PUFFS BY MOUTH TWICE DAILY. RINSE MOUTH AFTER USE     Allergies:   Iodine, Metrizamide, Prednisone, Contrast media [iodinated diagnostic agents], Doxycycline, and Iohexol   Social History   Socioeconomic History   Marital status: Married    Spouse name: Not on file   Number of children: 2   Years of education: Not on file   Highest education level: Not on file  Occupational History    Employer: [employer]  Social Needs   Financial resource strain: Not on file   Food insecurity    Worry: Not on file    Inability: Not on file   Transportation needs    Medical: Not on file    Non-medical: Not on file  Tobacco Use   Smoking status: Never Smoker   Smokeless tobacco: Never Used  Substance and Sexual Activity   Alcohol use: No    Alcohol/week: 0.0 standard drinks   Drug use: No   Sexual activity: Not on file  Lifestyle   Physical activity    Days per week: Not on file    Minutes per session: Not on file   Stress: Not on file  Relationships   Social connections    Talks on phone: Not on file    Gets  together: Not on file    Attends religious service: Not on file    Active member of club or organization: Not on file    Attends meetings of clubs or organizations: Not on file    Relationship status: Not on file  Other Topics Concern   Not on file  Social History Narrative   Lives at home with husband and dog.      Family History: The patient's family history includes Arthritis in her mother; Asthma in her maternal grandfather; CAD (age of onset: 72) in her brother; CAD (age of onset: 42) in  her sister; CAD (age of onset: 7262) in her mother; Cancer - Lung in her father; Diabetes in her brother and brother; Heart disease in her father; Parkinsonism in her mother.  ROS:   Review of Systems  Constitution: Negative for decreased appetite, fever and weight gain.  HENT: Negative for congestion, ear discharge, hoarse voice and sore throat.   Eyes: Negative for discharge, redness, vision loss in right eye and visual halos.  Cardiovascular: Negative for chest pain, dyspnea on exertion, leg swelling, orthopnea and palpitations.  Respiratory: Negative for cough, hemoptysis, shortness of breath and snoring.   Endocrine: Negative for heat intolerance and polyphagia.  Hematologic/Lymphatic: Negative for bleeding problem. Does not bruise/bleed easily.  Skin: Negative for flushing, nail changes, rash and suspicious lesions.  Musculoskeletal: Negative for arthritis, joint pain, muscle cramps, myalgias, neck pain and stiffness.  Gastrointestinal: Negative for abdominal pain, bowel incontinence, diarrhea and excessive appetite.  Genitourinary: Negative for decreased libido, genital sores and incomplete emptying.  Neurological: Negative for brief paralysis, focal weakness, headaches and loss of balance.  Psychiatric/Behavioral: Negative for altered mental status, depression and suicidal ideas.  Allergic/Immunologic: Negative for HIV exposure and persistent infections.    EKGs/Labs/Other Studies  Reviewed:    The following studies were reviewed today:   EKG:  The ekg ordered today demonstrates EKG show evidence of sinus rhythm, heart rate 61 bpm, with a prior EKG performed on January 14, 2019.  Exercise Nuclear stress test 02/11/2015.  Nuclear stress EF: 60%.  The left ventricular ejection fraction is normal (55-65%).  There was no ST segment deviation noted during stress; workload 7.8 mets.  The study is normal.  This is a low risk study.  Normal myocardial perfusion without scar or ischemia.  Recent Labs: No results found for requested labs within last 8760 hours.   Recent Lipid Panel No results found for: CHOL, TRIG, HDL, CHOLHDL, VLDL, LDLCALC, LDLDIRECT  Physical Exam:    VS:  BP (!) 160/90 (BP Location: Right Arm, Patient Position: Sitting, Cuff Size: Normal)    Pulse 62    Ht 5\' 3"  (1.6 m)    Wt 165 lb (74.8 kg)    SpO2 96%    BMI 29.23 kg/m     Wt Readings from Last 3 Encounters:  03/07/19 165 lb (74.8 kg)  01/14/19 168 lb (76.2 kg)  08/21/18 170 lb (77.1 kg)     GEN: Well nourished, well developed in no acute distress HEENT: Normal NECK: No JVD; No carotid bruits LYMPHATICS: No lymphadenopathy CARDIAC: S1S2 noted,RRR, no murmurs, rubs, gallops RESPIRATORY:  Clear to auscultation without rales, wheezing or rhonchi  ABDOMEN: Soft, non-tender, non-distended, +bowel sounds, no guarding. EXTREMITIES: No edema, No cyanosis, no clubbing MUSCULOSKELETAL:  No edema; No deformity  SKIN: Warm and dry NEUROLOGIC:  Alert and oriented x 3, non-focal PSYCHIATRIC:  Normal affect, good insight  ASSESSMENT:    1. Chest pain, unspecified type   2. Ventricular premature beats   3. Coronary artery calcification seen on CT scan    PLAN:    1.  I reviewed the patient collected nuclear stress test which she had for years ago which was normal.  Given her new symptoms and her risk factors appropriate to repeat this testing.  She has been educated again on the test.  Is  willing to proceed with this. She will continue her aspirin as well as Zetia.  She does not take his Zetia every day giving it upsets her stomach.  Her blood pressure is  located in the office today (manually taken by me 160/28mmhg) she reports that he is on a lot of stress lately and stressed not to have any new medication added.  She would take her blood pressure daily and will follow up in 1 month.  Advised patient if her blood pressure does not prove to be best to start on another antihypertensive.  She currently takes Lopressor 37.5 mg twice daily.   The patient is in agreement with the above plan. The patient left the office in stable condition.  The patient will follow up in 1 month with Dr. Dulce Sellar.   Medication Adjustments/Labs and Tests Ordered: Current medicines are reviewed at length with the patient today.  Concerns regarding medicines are outlined above.  Orders Placed This Encounter  Procedures   MYOCARDIAL PERFUSION IMAGING   EKG 12-Lead   No orders of the defined types were placed in this encounter.  Follow-Up: At [facility], you and your health needs are our priority. As part of our continuing mission to provide you with exceptional heart care, we have created designated Provider Care Teams. These Care Teams include your primary Cardiologist (physician) and Advanced Practice Providers (APPs -  Physician Assistants and Nurse Practitioners) who all work together to provide you with the care you need, when you need it.    Adopting a Healthy Lifestyle.  Know what a healthy weight is for you (roughly BMI <25) and aim to maintain this   Aim for 7+ servings of fruits and vegetables daily   65-80+ fluid ounces of water or unsweet tea for healthy kidneys   Limit to max 1 drink of alcohol per day; avoid smoking/tobacco   Limit animal fats in diet for cholesterol and heart health - choose grass fed whenever available   Avoid highly processed foods, and foods high in  saturated/trans fats   Aim for low stress - take time to unwind and care for your mental health   Aim for 150 min of moderate intensity exercise weekly for heart health, and weights twice weekly for bone health   Aim for 7-9 hours of sleep daily   When it comes to diets, agreement about the perfect plan isnt easy to find, even among the experts. Experts at the [facility] of [employer] developed an idea known as the Healthy Eating Plate. Just imagine a plate divided into logical, healthy portions.   The emphasis is on diet quality:   Load up on vegetables and fruits - one-half of your plate: Aim for color and variety, and remember that potatoes dont count.   Go for whole grains - one-quarter of your plate: Whole wheat, barley, wheat berries, quinoa, oats, brown rice, and foods made with them. If you want pasta, go with whole wheat pasta.   Protein power - one-quarter of your plate: Fish, chicken, beans, and nuts are all healthy, versatile protein sources. Limit red meat.   The diet, however, does go beyond the plate, offering a few other suggestions.   Use healthy plant oils, such as olive, canola, soy, corn, sunflower and peanut. Check the labels, and avoid partially hydrogenated oil, which have unhealthy trans fats.   If youre thirsty, drink water. Coffee and tea are good in moderation, but skip sugary drinks and limit milk and dairy products to one or two daily servings.   The type of carbohydrate in the diet is more important than the amount. Some sources of carbohydrates, such as vegetables, fruits, whole grains, and beans-are healthier  than others.   Finally, stay active  Signed, Thomasene Ripple, DO  03/08/2019 7:57 AM    [facility]

## 2019-03-06 NOTE — Telephone Encounter (Signed)
Has been having left arm pain for 2 weeks

## 2019-03-07 ENCOUNTER — Encounter: Payer: Self-pay | Admitting: Cardiology

## 2019-03-07 ENCOUNTER — Ambulatory Visit (INDEPENDENT_AMBULATORY_CARE_PROVIDER_SITE_OTHER): Payer: BLUE CROSS/BLUE SHIELD | Admitting: Cardiology

## 2019-03-07 ENCOUNTER — Other Ambulatory Visit: Payer: Self-pay

## 2019-03-07 VITALS — BP 160/90 | HR 62 | Ht 63.0 in | Wt 165.0 lb

## 2019-03-07 DIAGNOSIS — I493 Ventricular premature depolarization: Secondary | ICD-10-CM

## 2019-03-07 DIAGNOSIS — I2510 Atherosclerotic heart disease of native coronary artery without angina pectoris: Secondary | ICD-10-CM

## 2019-03-07 DIAGNOSIS — R079 Chest pain, unspecified: Secondary | ICD-10-CM

## 2019-03-07 NOTE — Patient Instructions (Signed)
Medication Instructions:  Your physician recommends that you continue on your current medications as directed. Please refer to the Current Medication list given to you today.  If you need a refill on your cardiac medications before your next appointment, please call your pharmacy.   Lab work: None.  If you have labs (blood work) drawn today and your tests are completely normal, you will receive your results only by: Marland Kitchen MyChart Message (if you have MyChart) OR . A paper copy in the mail If you have any lab test that is abnormal or we need to change your treatment, we will call you to review the results.  Testing/Procedures: Your physician has requested that you have a lexiscan myoview. For further information please visit [email or web address]. Please follow instruction sheet, as given.    Follow-Up: At [facility], you and your health needs are our priority.  As part of our continuing mission to provide you with exceptional heart care, we have created designated Provider Care Teams.  These Care Teams include your primary Cardiologist (physician) and Advanced Practice Providers (APPs -  Physician Assistants and Nurse Practitioners) who all work together to provide you with the care you need, when you need it. You will need a follow up appointment in 1 months.  Please call our office 2 months in advance to schedule this appointment.  You may see No primary care provider on file. or another member of our [employer] in [place]: Jenne Campus, MD . Jyl Heinz, MD  Any Other Special Instructions Will Be Listed Below (If Applicable).   Cardiac Nuclear Scan A cardiac nuclear scan is a test that measures blood flow to the heart when a person is resting and when he or she is exercising. The test looks for problems such as:  Not enough blood reaching a portion of the heart.  The heart muscle not working normally. You may need this test if:  You have heart disease.   You have had abnormal lab results.  You have had heart surgery or a balloon procedure to open up blocked arteries (angioplasty).  You have chest pain.  You have shortness of breath. In this test, a radioactive dye (tracer) is injected into your bloodstream. After the tracer has traveled to your heart, an imaging device is used to measure how much of the tracer is absorbed by or distributed to various areas of your heart. This procedure is usually done at a hospital and takes 2-4 hours. Tell a health care provider about:  Any allergies you have.  All medicines you are taking, including vitamins, herbs, eye drops, creams, and over-the-counter medicines.  Any problems you or family members have had with anesthetic medicines.  Any blood disorders you have.  Any surgeries you have had.  Any medical conditions you have.  Whether you are pregnant or may be pregnant. What are the risks? Generally, this is a safe procedure. However, problems may occur, including:  Serious chest pain and heart attack. This is only a risk if the stress portion of the test is done.  Rapid heartbeat.  Sensation of warmth in your chest. This usually passes quickly.  Allergic reaction to the tracer. What happens before the procedure?  Ask your health care provider about changing or stopping your regular medicines. This is especially important if you are taking diabetes medicines or blood thinners.  Follow instructions from your health care provider about eating or drinking restrictions.  Remove your jewelry on the day of the  procedure. What happens during the procedure?  An IV will be inserted into one of your veins.  Your health care provider will inject a small amount of radioactive tracer through the IV.  You will wait for 20-40 minutes while the tracer travels through your bloodstream.  Your heart activity will be monitored with an electrocardiogram (ECG).  You will lie down on an exam  table.  Images of your heart will be taken for about 15-20 minutes.  You may also have a stress test. For this test, one of the following may be done: ? You will exercise on a treadmill or stationary bike. While you exercise, your heart's activity will be monitored with an ECG, and your blood pressure will be checked. ? You will be given medicines that will increase blood flow to parts of your heart. This is done if you are unable to exercise.  When blood flow to your heart has peaked, a tracer will again be injected through the IV.  After 20-40 minutes, you will get back on the exam table and have more images taken of your heart.  Depending on the type of tracer used, scans may need to be repeated 3-4 hours later.  Your IV line will be removed when the procedure is over. The procedure may vary among health care providers and hospitals. What happens after the procedure?  Unless your health care provider tells you otherwise, you may return to your normal schedule, including diet, activities, and medicines.  Unless your health care provider tells you otherwise, you may increase your fluid intake. This will help to flush the contrast dye from your body. Drink enough fluid to keep your urine pale yellow.  Ask your health care provider, or the department that is doing the test: ? When will my results be ready? ? How will I get my results? Summary  A cardiac nuclear scan measures the blood flow to the heart when a person is resting and when he or she is exercising.  Tell your health care provider if you are pregnant.  Before the procedure, ask your health care provider about changing or stopping your regular medicines. This is especially important if you are taking diabetes medicines or blood thinners.  After the procedure, unless your health care provider tells you otherwise, increase your fluid intake. This will help flush the contrast dye from your body.  After the procedure, unless  your health care provider tells you otherwise, you may return to your normal schedule, including diet, activities, and medicines. This information is not intended to replace advice given to you by your health care provider. Make sure you discuss any questions you have with your health care provider. Document Released: 06/09/2004 Document Revised: 10/29/2017 Document Reviewed: 10/29/2017 Elsevier Patient Education  2020 [employer].

## 2019-03-12 ENCOUNTER — Telehealth (HOSPITAL_COMMUNITY): Payer: Self-pay | Admitting: *Deleted

## 2019-03-12 NOTE — Telephone Encounter (Signed)
Patient given detailed instructions per Myocardial Perfusion Study Information Sheet for the test on 03/17/19 at 7:30. Patient notified to arrive 15 minutes early and that it is imperative to arrive on time for appointment to keep from having the test rescheduled.  If you need to cancel or reschedule your appointment, please call the office within 24 hours of your appointment. . Patient verbalized understanding.Jessica Francis

## 2019-03-17 ENCOUNTER — Ambulatory Visit (HOSPITAL_COMMUNITY): Payer: BC Managed Care – PPO | Attending: Cardiology

## 2019-03-17 ENCOUNTER — Other Ambulatory Visit: Payer: Self-pay

## 2019-03-17 VITALS — Ht 63.0 in | Wt 165.0 lb

## 2019-03-17 DIAGNOSIS — I2510 Atherosclerotic heart disease of native coronary artery without angina pectoris: Secondary | ICD-10-CM | POA: Insufficient documentation

## 2019-03-17 DIAGNOSIS — R079 Chest pain, unspecified: Secondary | ICD-10-CM | POA: Diagnosis present

## 2019-03-17 LAB — MYOCARDIAL PERFUSION IMAGING
LV dias vol: 60 mL (ref 46–106)
LV sys vol: 20 mL
Peak HR: 114 {beats}/min
Rest HR: 66 {beats}/min
SDS: 3
SRS: 0
SSS: 3
TID: 0.92

## 2019-03-17 MED ORDER — TECHNETIUM TC 99M TETROFOSMIN IV KIT
11.0000 | PACK | Freq: Once | INTRAVENOUS | Status: AC | PRN
  Administered 2019-03-17: 11 via INTRAVENOUS
  Filled 2019-03-17: qty 11

## 2019-03-17 MED ORDER — TECHNETIUM TC 99M TETROFOSMIN IV KIT
32.3000 | PACK | Freq: Once | INTRAVENOUS | Status: AC | PRN
  Administered 2019-03-17: 32.3 via INTRAVENOUS
  Filled 2019-03-17: qty 33

## 2019-03-17 MED ORDER — REGADENOSON 0.4 MG/5ML IV SOLN
0.4000 mg | Freq: Once | INTRAVENOUS | Status: AC
  Administered 2019-03-17: 0.4 mg via INTRAVENOUS

## 2019-03-18 ENCOUNTER — Encounter: Payer: Self-pay | Admitting: *Deleted

## 2019-04-21 ENCOUNTER — Encounter: Payer: Self-pay | Admitting: Cardiology

## 2019-04-21 ENCOUNTER — Ambulatory Visit (INDEPENDENT_AMBULATORY_CARE_PROVIDER_SITE_OTHER): Payer: BC Managed Care – PPO | Admitting: Cardiology

## 2019-04-21 ENCOUNTER — Other Ambulatory Visit: Payer: Self-pay

## 2019-04-21 VITALS — BP 140/82 | HR 75 | Ht 63.0 in | Wt 165.1 lb

## 2019-04-21 DIAGNOSIS — E782 Mixed hyperlipidemia: Secondary | ICD-10-CM | POA: Diagnosis not present

## 2019-04-21 DIAGNOSIS — R079 Chest pain, unspecified: Secondary | ICD-10-CM

## 2019-04-21 DIAGNOSIS — I2510 Atherosclerotic heart disease of native coronary artery without angina pectoris: Secondary | ICD-10-CM | POA: Diagnosis not present

## 2019-04-21 HISTORY — DX: Chest pain, unspecified: R07.9

## 2019-04-21 MED ORDER — PREDNISONE 50 MG PO TABS: ORAL_TABLET | ORAL | 0 refills | Status: DC

## 2019-04-21 MED ORDER — NITROGLYCERIN 0.4 MG SL SUBL: 0.4000 mg | SUBLINGUAL_TABLET | SUBLINGUAL | 3 refills | Status: AC | PRN

## 2019-04-21 NOTE — Progress Notes (Signed)
Cardiology Office Note:    Date:  04/21/2019   ID:  Jessica Francis, DOB 08-Dec-1960, MRN [id number]  PCP:  Paulina Fusi, MD  Cardiologist:  Norman Herrlich, MD    Referring MD: Paulina Fusi, MD    ASSESSMENT:    1. Chest pain, unspecified type   2. Mixed hyperlipidemia    PLAN:    In order of problems listed above:  1. Ongoing symptoms quite suggestive of angina despite normal myocardial perfusion study needs further evaluation and should be diet prepped and undergo CTA heart expedited.  In the interim continue statin rosuvastatin 5 mg daily beta-blocker and nitroglycerin as needed.   Next appointment: 2 weeks   Medication Adjustments/Labs and Tests Ordered: Current medicines are reviewed at length with the patient today.  Concerns regarding medicines are outlined above.  No orders of the defined types were placed in this encounter.  No orders of the defined types were placed in this encounter.   Chief Complaint  Patient presents with  . Follow-up    after MPI    History of Present Illness:    Jessica Francis is a 58 y.o. female with a hx of of palpitations with frequent PVC's and PAC's, asthma, GERD  last seen 03/07/2019 by Dr Servando Salina for chest pain. Compliance with diet, lifestyle and medications: Yes  Reviewed the test below with the patient  Nuclear Stress Findings  Isotope administration Rest isotope was administered  with an IV injection of 11.0 mCi Tc78m Tetrofosmin.  Rest SPECT images were obtained approximately 45 minutes post tracer injection.  Stress isotope was administered  with an IV injection of 32.3 mCi Tc63m Tetrofosmin   Stress SPECT images were obtained approximately 60 minutes post tracer injection.  Nuclear Measurements Study was gated.  Rest Perfusion Rest perfusion normal.  Stress Perfusion Stress perfusion normal.  Overall Study Impression Myocardial perfusion is normal.   The study is normal.   This is a low risk study.  Overall left  ventricular systolic function was normal.    LV cavity size is normal.  Nuclear stress EF:  66  Stress Findings  ECG Baseline ECG exhibits normal sinus rhythm..  Stress Findings A pharmacological stress test was performed using IV Lexiscan 0.4mg  over 10 seconds performed without concurrent submaximal exercise.   The patient reported shortness of breath during the stress test. Chest burning   Test was stopped per protocol.  Response to Stress There was no ST segment deviation noted during stress   She is not doing well and is worse since she had the perfusion study.  She had one episode where she was awakened from her sleep severe chest pressure and burning that lasted 15 minutes.  She has had repeated episodes almost daily where with emotional stress she develops pressure and burning substernally and is relieved with rest.  The symptoms are not exertional.  Its not pleuritic she is not short of breath no associated GI symptoms and no radiation.  She is so concerned she started taking rosuvastatin 5 mg daily again.  Discussed further evaluation I advise coronary angiography she declined and chose to have cardiac CTA will expedite the test.  Today I will check a troponin if elevated will treat her as acute coronary syndrome.  I asked her to continue her beta-blocker gave her prescription for nitroglycerin to use if she has a severe episode  Past Medical History:  Diagnosis Date  . Acute cholecystitis with chronic cholecystitis 03/06/2016  . Alopecia   .  Asthma   . Biliary dyskinesia 03/02/2016  . Bunion   . CAD in native artery 03/15/2013  . Cardiac dysrhythmia, unspecified   . 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 Cervicalgia   . Cholecystitis, chronic 03/02/2016  . Complication of anesthesia    hard to wake up  . Conductive hearing loss in right ear 04/05/2015  . Coronary artery calcification seen on CT scan 04/26/2015   Overview:  Normal stress MPS at 7.8 mets  Sept 2016 EF 60%  . Diarrhea in adult patient 01/25/2015  . Erosive gastritis 03/02/2016  . Headache(784.0)   . 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 History of shingles 01/25/2015   Overview:  In 10/2014; had full therapy; right scapula area  . Hyperlipidemia 04/07/2013  . Left arm pain 02/23/2015  . Lumbosacral spondylosis 04/26/2015  . Lung mass 04/07/2013   Chronic scarring left upper lobe does not represent a malignant process and does NOT NEED  further imaging as has not changed since the year 2000. Pt is aware and Radiology has issued a supplement report indicating no changes.  Overview:  Overview:  Chronic scarring left upper lobe does not represent a malignant process and does NOT NEED  further imaging as has not changed since the year 2000. Pt is aware and Radiology has issued a supplement report indicating no changes.  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 Mild persistent asthma without complication 12/04/6281  . Moderate persistent asthma 04/07/2013  . Pain in limb   . Postmenopausal atrophic vaginitis   . Premature atrial contraction 02/23/2015  . Pure hypercholesterolemia   . Unspecified sinusitis (chronic)   . Ventricular premature beats 02/23/2015   Overview:  With hypokalemia    Past Surgical History:  Procedure Laterality Date  . CHOLECYSTECTOMY    . HARDWARE REMOVAL  05/04/2011   Procedure: HARDWARE REMOVAL;  Surgeon: Cammie Sickle., MD;  Location: [facility];  Service: Orthopedics;  Laterality:  Left;  DVR plate removal left wrist,  . INNER EAR SURGERY     Perforated eardrum  . SHOULDER ARTHROSCOPY  1997   lt  . TUBAL LIGATION    . WRIST ARTHROPLASTY  9/12   lt -[facility]    Current Medications: Current Meds  Medication Sig  . albuterol (PROVENTIL HFA;VENTOLIN HFA) 108 (90 BASE) MCG/ACT inhaler Inhale 2 puffs into the lungs every 6 (six) hours as needed for wheezing.  Marland Kitchen aspirin 81 MG tablet Take 81 mg by mouth daily.  . cholecalciferol (VITAMIN D) 1000 UNITS tablet Take 1,000 Units by mouth daily.    Marland Kitchen esomeprazole (NEXIUM) 40 MG capsule TAKE 1 CAPSULE BY MOUTH ONCE DAILY IN THE MORNING 30 MINUTES BEFORE BREAKFAST  . fluticasone (FLONASE) 50 MCG/ACT nasal spray Place 2 sprays into the nose daily.    . Magnesium 500 MG CAPS Take 500 mg by mouth daily.   . metoprolol tartrate (LOPRESSOR) 25 MG tablet Take 1.5 tablets (37.5 mg total)  by mouth 2 (two) times daily.  Marland Kitchen. QVAR REDIHALER 80 MCG/ACT inhaler INHALE 2 PUFFS BY MOUTH TWICE DAILY. RINSE MOUTH AFTER USE     Allergies:   Iodine, Metrizamide, Prednisone, Contrast media [iodinated diagnostic agents], Doxycycline, and Iohexol   Social History   Socioeconomic History  . Marital status: Married    Spouse name: Not on file  . Number of children: 2  . Years of education: Not on file  . Highest education level: Not on file  Occupational History    Employer: [employer]  Social Needs  . Financial resource strain: Not on file  . Food insecurity    Worry: Not on file    Inability: Not on file  . Transportation needs    Medical: Not on file    Non-medical: Not on file  Tobacco Use  . Smoking status: Never Smoker  . Smokeless tobacco: Never Used  Substance and Sexual Activity  . Alcohol use: No    Alcohol/week: 0.0 standard drinks  . Drug use: No  . Sexual activity: Not on file  Lifestyle  . Physical activity    Days per week: Not on file    Minutes per session: Not on file  . Stress: Not on file  Relationships   . Social Musicianconnections    Talks on phone: Not on file    Gets together: Not on file    Attends religious service: Not on file    Active member of club or organization: Not on file    Attends meetings of clubs or organizations: Not on file    Relationship status: Not on file  Other Topics Concern  . Not on file  Social History Narrative   Lives at home with husband and dog.      Family History: The patient's family history includes Arthritis in her mother; Asthma in her maternal grandfather; CAD (age of onset: 2247) in her brother; CAD (age of onset: 5752) in her sister; CAD (age of onset: 862) in her mother; Cancer - Lung in her father; Diabetes in her brother and brother; Heart disease in her father; Parkinsonism in her mother. ROS:   Please see the history of present illness.    All other systems reviewed and are negative.  EKGs/Labs/Other Studies Reviewed:    The following studies were reviewed today:  EKG:  EKG 03/10/2019 personally reviewed.  The ekg demonstrates sinus rhythm and is normal EKG today sinus rhythm normal Recent Labs: 12/30/2018 cholesterol 229 LDL 158 HDL 59 creatinine normal  Physical Exam:    VS:  BP 140/82 (BP Location: Right Arm, Patient Position: Sitting, Cuff Size: Normal)   Pulse 75   Ht 5\' 3"  (1.6 m)   Wt 165 lb 1.9 oz (74.9 kg)   SpO2 99%   BMI 29.25 kg/m     Wt Readings from Last 3 Encounters:  04/21/19 165 lb 1.9 oz (74.9 kg)  03/17/19 165 lb (74.8 kg)  03/07/19 165 lb (74.8 kg)     GEN:  Well nourished, well developed in no acute distress HEENT: Normal NECK: No JVD; No carotid bruits LYMPHATICS: No lymphadenopathy CARDIAC: RRR, no murmurs, rubs, gallops RESPIRATORY:  Clear to auscultation without rales, wheezing or rhonchi  ABDOMEN: Soft, non-tender, non-distended MUSCULOSKELETAL:  No edema; No deformity  SKIN: Warm and dry NEUROLOGIC:  Alert and oriented x 3 PSYCHIATRIC:  Normal affect    Signed, Norman HerrlichBrian Aayliah Rotenberry, MD  04/21/2019 2:04  PM    [facility]

## 2019-04-21 NOTE — Patient Instructions (Addendum)
Medication Instructions:  Your physician has recommended you make the following change in your medication:   START nitroglycerin as needed for chest pain: When having chest pain, stop what you are doing and sit down. Take 1 nitro, wait 5 minutes. Still having chest pain, take 1 nitro, wait 5 minutes. Still having chest pain, take 1 nitro, dial 911. Total of 3 nitro in 15 minutes.  *If you need a refill on your cardiac medications before your next appointment, please call your pharmacy*  Lab Work: Your physician recommends that you return for lab work today: BMP, lipid panel, Troponin I.   Your physician recommends that you return for lab work within 3-7 days before your cardiac CTA: BMP. Please return to our office for lab work, no appointment needed. No need to fast beforehand.   If you have labs (blood work) drawn today and your tests are completely normal, you will receive your results only by: Marland Kitchen MyChart Message (if you have MyChart) OR . A paper copy in the mail If you have any lab test that is abnormal or we need to change your treatment, we will call you to review the results.  Testing/Procedures: You had an EKG today.   Your physician has requested that you have cardiac CT. Cardiac computed tomography (CT) is a painless test that uses an x-ray machine to take clear, detailed pictures of your heart. For further information please visit [email or web address] Please follow instruction sheet as given.   Your cardiac CT will be scheduled at one of the below locations:   [facility] [address] [place], [place] [address] [phone]  If scheduled at [facility], please arrive at the [facility] main entrance of [facility] 30-45 minutes prior to test start time. Proceed to the [facility] Radiology Department (first floor) to check-in and test prep.  Please follow these instructions carefully (unless otherwise directed):  On the Night Before the  Test: . Be sure to Drink plenty of water. . Do not consume any caffeinated/decaffeinated beverages or chocolate 12 hours prior to your test. . Do not take any antihistamines 12 hours prior to your test. . If the patient has contrast allergy: ? Patient will need a prescription for Prednisone and very clear instructions (as follows): 1. Prednisone 50 mg - take 13 hours prior to test 2. Take another Prednisone 50 mg 7 hours prior to test 3. Take another Prednisone 50 mg 1 hour prior to test 4. Take Benadryl 50 mg 1 hour prior to test . Patient must complete all four doses of above prophylactic medications. . Patient will need a ride after test due to Benadryl.  On the Day of the Test: . Drink plenty of water. Do not drink any water within one hour of the test. . Do not eat any food 4 hours prior to the test. . You may take your regular medications prior to the test.  . Take metoprolol (Lopressor) two hours prior to test. . FEMALES- please wear underwire-free bra if available       After the Test: . Drink plenty of water. . After receiving IV contrast, you may experience a mild flushed feeling. This is normal. . On occasion, you may experience a mild rash up to 24 hours after the test. This is not dangerous. If this occurs, you can take Benadryl 25 mg and increase your fluid intake. . If you experience trouble breathing, this can be serious. If it is severe call  [phone] IMMEDIATELY. If it is mild, please call our office.   Once we have confirmed authorization from your insurance company, we will call you to set up a date and time for your test.   For non-scheduling related questions, please contact the cardiac imaging nurse navigator should you have any questions/concerns: Rockwell Alexandria, RN Navigator Cardiac Imaging [facility] [phone] Office   Follow-Up: At [facility], you and your health needs are our priority.  As part of our continuing mission to  provide you with exceptional heart care, we have created designated Provider Care Teams.  These Care Teams include your primary Cardiologist (physician) and Advanced Practice Providers (APPs -  Physician Assistants and Nurse Practitioners) who all work together to provide you with the care you need, when you need it.  Your next appointment:   6 week(s)  The format for your next appointment:   In Person  Provider:   Norman Herrlich, MD     Cardiac CT Angiogram  A cardiac CT angiogram is a procedure to look at the heart and the area around the heart. It may be done to help find the cause of chest pains or other symptoms of heart disease. During this procedure, a large X-ray machine, called a CT scanner, takes detailed pictures of the heart and the surrounding area after a dye (contrast material) has been injected into blood vessels in the area. The procedure is also sometimes called a coronary CT angiogram, coronary artery scanning, or CTA. A cardiac CT angiogram allows the health care provider to see how well blood is flowing to and from the heart. The health care provider will be able to see if there are any problems, such as:  Blockage or narrowing of the coronary arteries in the heart.  Fluid around the heart.  Signs of weakness or disease in the muscles, valves, and tissues of the heart. Tell a health care provider about:  Any allergies you have. This is especially important if you have had a previous allergic reaction to contrast dye.  All medicines you are taking, including vitamins, herbs, eye drops, creams, and over-the-counter medicines.  Any blood disorders you have.  Any surgeries you have had.  Any medical conditions you have.  Whether you are pregnant or may be pregnant.  Any anxiety disorders, chronic pain, or other conditions you have that may increase your stress or prevent you from lying still. What are the risks? Generally, this is a safe procedure. However,  problems may occur, including:  Bleeding.  Infection.  Allergic reactions to medicines or dyes.  Damage to other structures or organs.  Kidney damage from the dye or contrast that is used.  Increased risk of cancer from radiation exposure. This risk is low. Talk with your health care provider about: ? The risks and benefits of testing. ? How you can receive the lowest dose of radiation. What happens before the procedure?  Wear comfortable clothing and remove any jewelry, glasses, dentures, and hearing aids.  Follow instructions from your health care provider about eating and drinking. This may include: ? For 12 hours before the test - avoid caffeine. This includes tea, coffee, soda, energy drinks, and diet pills. Drink plenty of water or other fluids that do not have caffeine in them. Being well-hydrated can prevent complications. ? For 4-6 hours before the test - stop eating and drinking. The contrast dye can cause nausea, but this is less likely if your stomach is empty.  Ask your health care provider about changing or stopping your regular medicines. This is especially important if you are taking diabetes medicines, blood thinners, or medicines to treat erectile dysfunction. What happens during the procedure?  Hair on your chest may need to be removed so that small sticky patches called electrodes can be placed on your chest. These will transmit information that helps to monitor your heart during the test.  An IV tube will be inserted into one of your veins.  You might be given a medicine to control your heart rate during the test. This will help to ensure that good images are obtained.  You will be asked to lie on an exam table. This table will slide in and out of the CT machine during the procedure.  Contrast dye will be injected into the IV tube. You might feel warm, or you may get a metallic taste in your mouth.  You will be given a medicine (nitroglycerin) to relax  (dilate) the arteries in your heart.  The table that you are lying on will move into the CT machine tunnel for the scan.  The person running the machine will give you instructions while the scans are being done. You may be asked to: ? Keep your arms above your head. ? Hold your breath. ? Stay very still, even if the table is moving.  When the scanning is complete, you will be moved out of the machine.  The IV tube will be removed. The procedure may vary among health care providers and hospitals. What happens after the procedure?  You might feel warm, or you may get a metallic taste in your mouth from the contrast dye.  You may have a headache from the nitroglycerin.  After the procedure, drink water or other fluids to wash (flush) the contrast material out of your body.  Contact a health care provider if you have any symptoms of allergy to the contrast. These symptoms include: ? Shortness of breath. ? Rash or hives. ? A racing heartbeat.  Most people can return to their normal activities right after the procedure. Ask your health care provider what activities are safe for you.  It is up to you to get the results of your procedure. Ask your health care provider, or the department that is doing the procedure, when your results will be ready. Summary  A cardiac CT angiogram is a procedure to look at the heart and the area around the heart. It may be done to help find the cause of chest pains or other symptoms of heart disease.  During this procedure, a large X-ray machine, called a CT scanner, takes detailed pictures of the heart and the surrounding area after a dye (contrast material) has been injected into blood vessels in the area.  Ask your health care provider about changing or stopping your regular medicines before the procedure. This is especially important if you are taking diabetes medicines, blood thinners, or medicines to treat erectile dysfunction.  After the procedure,  drink water or other fluids to wash (flush) the contrast material out of your body. This information is not intended to replace advice given to you by your health care provider. Make sure you discuss any questions you have with your health care provider. Document Released: 04/27/2008 Document Revised: 04/27/2017 Document Reviewed: 04/03/2016 Elsevier Patient Education  [place].   Nitroglycerin sublingual tablets What is this medicine? NITROGLYCERIN (nye troe GLI ser in) is a type of vasodilator. It relaxes blood  vessels, increasing the blood and oxygen supply to your heart. This medicine is used to relieve chest pain caused by angina. It is also used to prevent chest pain before activities like climbing stairs, going outdoors in cold weather, or sexual activity. This medicine may be used for other purposes; ask your health care provider or pharmacist if you have questions. COMMON BRAND NAME(S): Nitroquick, Nitrostat, Nitrotab What should I tell my health care provider before I take this medicine? They need to know if you have any of these conditions:  anemia  head injury, recent stroke, or bleeding in the brain  liver disease  previous heart attack  an unusual or allergic reaction to nitroglycerin, other medicines, foods, dyes, or preservatives  pregnant or trying to get pregnant  breast-feeding How should I use this medicine? Take this medicine by mouth as needed. At the first sign of an angina attack (chest pain or tightness) place one tablet under your tongue. You can also take this medicine 5 to 10 minutes before an event likely to produce chest pain. Follow the directions on the prescription label. Let the tablet dissolve under the tongue. Do not swallow whole. Replace the dose if you accidentally swallow it. It will help if your mouth is not dry. Saliva around the tablet will help it to dissolve more quickly. Do not eat or drink, smoke or chew tobacco while a tablet is  dissolving. If you are not better within 5 minutes after taking ONE dose of nitroglycerin, call 9-1-1 immediately to seek emergency medical care. Do not take more than 3 nitroglycerin tablets over 15 minutes. If you take this medicine often to relieve symptoms of angina, your doctor or health care professional may provide you with different instructions to manage your symptoms. If symptoms do not go away after following these instructions, it is important to call 9-1-1 immediately. Do not take more than 3 nitroglycerin tablets over 15 minutes. Talk to your pediatrician regarding the use of this medicine in children. Special care may be needed. Overdosage: If you think you have taken too much of this medicine contact a poison control center or emergency room at once. NOTE: This medicine is only for you. Do not share this medicine with others. What if I miss a dose? This does not apply. This medicine is only used as needed. What may interact with this medicine? Do not take this medicine with any of the following medications:  certain migraine medicines like ergotamine and dihydroergotamine (DHE)  medicines used to treat erectile dysfunction like sildenafil, tadalafil, and vardenafil  riociguat This medicine may also interact with the following medications:  alteplase  aspirin  heparin  medicines for high blood pressure  medicines for mental depression  other medicines used to treat angina  phenothiazines like chlorpromazine, mesoridazine, prochlorperazine, thioridazine This list may not describe all possible interactions. Give your health care provider a list of all the medicines, herbs, non-prescription drugs, or dietary supplements you use. Also tell them if you smoke, drink alcohol, or use illegal drugs. Some items may interact with your medicine. What should I watch for while using this medicine? Tell your doctor or health care professional if you feel your medicine is no longer  working. Keep this medicine with you at all times. Sit or lie down when you take your medicine to prevent falling if you feel dizzy or faint after using it. Try to remain calm. This will help you to feel better faster. If you feel dizzy, take several  deep breaths and lie down with your feet propped up, or bend forward with your head resting between your knees. You may get drowsy or dizzy. Do not drive, use machinery, or do anything that needs mental alertness until you know how this drug affects you. Do not stand or sit up quickly, especially if you are an older patient. This reduces the risk of dizzy or fainting spells. Alcohol can make you more drowsy and dizzy. Avoid alcoholic drinks. Do not treat yourself for coughs, colds, or pain while you are taking this medicine without asking your doctor or health care professional for advice. Some ingredients may increase your blood pressure. What side effects may I notice from receiving this medicine? Side effects that you should report to your doctor or health care professional as soon as possible:  blurred vision  dry mouth  skin rash  sweating  the feeling of extreme pressure in the head  unusually weak or tired Side effects that usually do not require medical attention (report to your doctor or health care professional if they continue or are bothersome):  flushing of the face or neck  headache  irregular heartbeat, palpitations  nausea, vomiting This list may not describe all possible side effects. Call your doctor for medical advice about side effects. You may report side effects to FDA at 1-800-FDA-1088. Where should I keep my medicine? Keep out of the reach of children. Store at room temperature between 20 and 25 degrees C (68 and 77 degrees F). Store in Retail buyeroriginal container. Protect from light and moisture. Keep tightly closed. Throw away any unused medicine after the expiration date. NOTE: This sheet is a summary. It may not cover  all possible information. If you have questions about this medicine, talk to your doctor, pharmacist, or health care provider.  2020 Elsevier/Gold Standard (2013-03-13 17:57:36)

## 2019-04-22 LAB — BASIC METABOLIC PANEL
BUN/Creatinine Ratio: 12 (ref 9–23)
BUN: 10 mg/dL (ref 6–24)
CO2: 26 mmol/L (ref 20–29)
Calcium: 9.4 mg/dL (ref 8.7–10.2)
Chloride: 105 mmol/L (ref 96–106)
Creatinine, Ser: 0.84 mg/dL (ref 0.57–1.00)
GFR calc Af Amer: 88 mL/min/{1.73_m2} (ref >59)
GFR calc non Af Amer: 76 mL/min/{1.73_m2} (ref >59)
Glucose: 93 mg/dL (ref 65–99)
Potassium: 4.4 mmol/L (ref 3.5–5.2)
Sodium: 142 mmol/L (ref 134–144)

## 2019-04-22 LAB — TROPONIN I: Troponin I: <0.01 ng/mL (ref 0.00–0.04)

## 2019-05-26 ENCOUNTER — Ambulatory Visit: Payer: BC Managed Care – PPO | Admitting: Allergy and Immunology

## 2019-05-27 ENCOUNTER — Telehealth (HOSPITAL_COMMUNITY): Payer: Self-pay | Admitting: Emergency Medicine

## 2019-05-27 ENCOUNTER — Encounter (HOSPITAL_COMMUNITY): Payer: Self-pay

## 2019-05-27 NOTE — Telephone Encounter (Signed)
Reaching out to patient to offer assistance regarding upcoming cardiac imaging study; pt verbalizes understanding of appt date/time, parking situation and where to check in, pre-test NPO status and medications ordered, and verified current allergies; name and call back number provided for further questions should they arise Mannix Kroeker RN Navigator Cardiac Imaging  Heart [facility] Vascular [phone] office [phone] cell 

## 2019-05-28 ENCOUNTER — Ambulatory Visit (HOSPITAL_COMMUNITY)
Admission: RE | Admit: 2019-05-28 | Discharge: 2019-05-28 | Disposition: A | Discharge status: Home / Self Care | Payer: BC Managed Care – PPO | Source: Ambulatory Visit | Attending: Cardiology | Admitting: Cardiology

## 2019-05-28 ENCOUNTER — Encounter (HOSPITAL_COMMUNITY): Payer: Self-pay

## 2019-05-28 ENCOUNTER — Other Ambulatory Visit: Payer: Self-pay

## 2019-05-28 DIAGNOSIS — R079 Chest pain, unspecified: Secondary | ICD-10-CM | POA: Insufficient documentation

## 2019-05-28 MED ORDER — NITROGLYCERIN 0.4 MG SL SUBL: 0.8000 mg | SUBLINGUAL_TABLET | Freq: Once | SUBLINGUAL | Status: DC

## 2019-05-28 MED ORDER — METOPROLOL TARTRATE 5 MG/5ML IV SOLN
INTRAVENOUS | Status: AC
  Administered 2019-05-28: 5 mg via INTRAVENOUS
  Filled 2019-05-28: qty 10

## 2019-05-28 MED ORDER — METOPROLOL TARTRATE 5 MG/5ML IV SOLN
5.0000 mg | INTRAVENOUS | Status: DC | PRN
  Administered 2019-05-28: 5 mg via INTRAVENOUS

## 2019-05-28 MED ORDER — IOHEXOL 350 MG/ML SOLN
100.0000 mL | Freq: Once | INTRAVENOUS | Status: AC | PRN
  Administered 2019-05-28: 100 mL via INTRAVENOUS

## 2019-05-28 MED ORDER — NITROGLYCERIN 0.4 MG SL SUBL
SUBLINGUAL_TABLET | SUBLINGUAL | Status: AC
  Filled 2019-05-28: qty 2

## 2019-05-30 DIAGNOSIS — R079 Chest pain, unspecified: Secondary | ICD-10-CM | POA: Diagnosis not present

## 2019-06-08 NOTE — Progress Notes (Signed)
Cardiology Office Note:    Date:  06/09/2019   ID:  Jessica Francis, DOB [birth year], MRN [id number]  PCP:  Paulina Fusi, MD  Cardiologist:  Norman Herrlich, MD    Referring MD: Paulina Fusi, MD    ASSESSMENT:    1. Mild CAD   2. Mixed hyperlipidemia   3. Coronary artery calcification seen on CT scan   4. Ventricular premature beats    PLAN:    In order of problems listed above:  1. Stable presently having no anginal discomfort continue medical therapy and restart lipid-lowering with intermittent low intensity statin and if intolerant PCSK9. 2. Asymptomatic continue beta-blocker   Next appointment: 6 months   Medication Adjustments/Labs and Tests Ordered: Current medicines are reviewed at length with the patient today.  Concerns regarding medicines are outlined above.  No orders of the defined types were placed in this encounter.  No orders of the defined types were placed in this encounter.   Chief Complaint  Patient presents with  . Follow-up  . Coronary Artery Disease    History of Present Illness:    Jessica Francis is a 59 y.o. female with a hx of chest pain,palpitations with frequent PVC's and PAC's, asthma, GERD  last seen 04/21/2019. Compliance with diet, lifestyle and medications: Yes  I reviewed her myocardial perfusion study and cardiac CTA with the patient.  As expected she has CAD mild nonflow limiting is done well with medical treatment.  Unfortunately could not tolerate Zetia with GI upset and would like to try a low-dose intermittent pravastatin prior to PCSK9.  If intolerant we will initiate her on antibiotic therapy.  She is presently having no angina edema shortness of breath or syncope.  Nuclear Stress Findings 03/17/2019  Isotope administration Rest isotope was administered with an IV injection of 11.0 mCi Tc28m Tetrofosmin. Rest SPECT images were obtained approximately 45 minutes post tracer injection. Stress isotope was administered  with an IV injection of 32.3 mCi Tc25m Tetrofosmin Stress SPECT images were obtained approximately 60 minutes post tracer injection.  Nuclear Measurements Study was gated.  Rest Perfusion Rest perfusion normal.  Stress Perfusion Stress perfusion normal.  Overall Study Impression Myocardial perfusion is normal. The study is normal. This is a low risk study. Overall left ventricular systolic function was normal. LV cavity size is normal. Nuclear stress EF: 66  Stress Findings  ECG Baseline ECG exhibits normal sinus rhythm..  Stress Findings A pharmacological stress test was performed using IV Lexiscan 0.4mg  over 10 seconds performed without concurrent submaximal exercise.  The patient reported shortness of breath during the stress test. Chest burning   Test was stopped per protocol.  Response to Stress There was no ST segment deviation noted during stress   Cardiac CTA 05/28/2019 shows elevated score 359 which is 97th percentile for age and sex normal coronary origin and numbness moderate obstructive disease in the left circumflex coronary artery 50 to 69% with subsequent normal fractional flow reserve consistent with nonobstructive CAD.  Past Medical History:  Diagnosis Date  . Acute cholecystitis with chronic cholecystitis 03/06/2016  . Alopecia   . Asthma   . Biliary dyskinesia 03/02/2016  . Bunion   . CAD in native artery 03/15/2013  . Cardiac dysrhythmia, unspecified   . 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 Cervicalgia   . Cholecystitis, chronic 03/02/2016  . Complication of anesthesia    hard to wake up  . Conductive hearing loss in right ear  04/05/2015  . Coronary artery calcification seen on CT scan 04/26/2015   Overview:  Normal stress MPS at 7.8 mets Sept 2016 EF 60%  . Diarrhea in adult patient 01/25/2015  . Erosive gastritis 03/02/2016  . Headache(784.0)   . 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  History of shingles 01/25/2015   Overview:  In 10/2014; had full therapy; right scapula area  . Hyperlipidemia 04/07/2013  . Left arm pain 02/23/2015  . Lumbosacral spondylosis 04/26/2015  . Lung mass 04/07/2013   Chronic scarring left upper lobe does not represent a malignant process and does NOT NEED  further imaging as has not changed since the year 2000. Pt is aware and Radiology has issued a supplement report indicating no changes.  Overview:  Overview:  Chronic scarring left upper lobe does not represent a malignant process and does NOT NEED  further imaging as has not changed since the year 2000. Pt is aware and Radiology has issued a supplement report indicating no changes.  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 Mild persistent asthma without complication 01/25/2015  . Moderate persistent asthma 04/07/2013  . Pain in limb   . Postmenopausal atrophic vaginitis   . Premature atrial contraction 02/23/2015  . Pure hypercholesterolemia   . Unspecified sinusitis (chronic)   . Ventricular premature beats 02/23/2015   Overview:  With hypokalemia    Past Surgical History:  Procedure Laterality Date  . CHOLECYSTECTOMY    . HARDWARE REMOVAL  05/04/2011   Procedure: HARDWARE REMOVAL;  Surgeon: Wyn Forster., MD;  Location: [facility];  Service: Orthopedics;  Laterality: Left;  DVR plate removal left wrist,  . INNER EAR SURGERY     Perforated eardrum  . SHOULDER ARTHROSCOPY  1997   lt  . TUBAL LIGATION    . WRIST ARTHROPLASTY  9/12   lt -[facility]    Current Medications: No outpatient medications have been marked as taking for the 06/09/19 encounter (Office Visit) with Baldo Daub, MD.     Allergies:   Iodine, Metrizamide, Prednisone, Contrast media [iodinated diagnostic agents], Doxycycline, and Iohexol   Social History   Socioeconomic History  . Marital status: Married    Spouse name: Not on file  . Number of children: 2  . Years of education: Not on file  . Highest education level: Not on file  Occupational History    Employer: [id number]  Tobacco Use  . Smoking status: Never Smoker  . Smokeless tobacco: Never Used  Substance and Sexual Activity  . Alcohol use: No    Alcohol/week: 0.0 standard drinks  . Drug use: No  . Sexual activity: Not on file  Other Topics Concern  . Not on file  Social History Narrative   Lives at home with husband and dog.    Social Determinants of Health   Financial Resource Strain:   . Difficulty of Paying Living Expenses: Not on file  Food Insecurity:   . Worried About Radiation protection practitioner  of Food in the Last Year: Not on file  . Ran Out of Food in the Last Year: Not on file  Transportation Needs:   . Lack of Transportation (Medical): Not on file  . Lack of Transportation (Non-Medical): Not on file  Physical Activity:   . Days of Exercise per Week: Not on file  . Minutes of Exercise per Session: Not on file  Stress:   . Feeling of Stress : Not on file  Social Connections:   . Frequency of Communication with Friends and Family: Not on file  . Frequency of Social Gatherings with Friends and Family: Not on file  . Attends Religious Services: Not on file  . Active Member of Clubs or Organizations: Not on file  . Attends Archivist Meetings: Not on file  . Marital Status: Not on file     Family History: The patient's family history includes Arthritis in her mother; Asthma in her maternal grandfather; CAD (age of onset: 7) in her brother; CAD (age of onset: 3) in her  sister; CAD (age of onset: 37) in her mother; Cancer - Lung in her father; Diabetes in her brother and brother; Heart disease in her father; Parkinsonism in her mother. ROS:   Please see the history of present illness.    All other systems reviewed and are negative.  EKGs/Labs/Other Studies Reviewed:    The following studies were reviewed today:  EKG:  The ekg ordered today demonstrates sinus rhythm normal EKG 04/21/2019 personally reviewed  Recent Labs: 04/21/2019: BUN 10; Creatinine, Ser 0.84; Potassium 4.4; Sodium 142  Recent Lipid Panel 12/30/2018: Cholesterol 229 LDL 158 HDL 59 triglyceride 61 06/20/2018: Creatinine 0.8 follow-up test 10 four-point  Physical Exam:    VS:  BP 122/82   Pulse 86   Ht 5\' 3"  (1.6 m)   Wt 166 lb (75.3 kg)   SpO2 98%   BMI 29.41 kg/m     Wt Readings from Last 3 Encounters:  06/09/19 166 lb (75.3 kg)  04/21/19 165 lb 1.9 oz (74.9 kg)  03/17/19 165 lb (74.8 kg)     GEN:  Well nourished, well developed in no acute distress HEENT: Normal NECK: No JVD; No carotid bruits LYMPHATICS: No lymphadenopathy CARDIAC: RRR, no murmurs, rubs, gallops RESPIRATORY:  Clear to auscultation without rales, wheezing or rhonchi  ABDOMEN: Soft, non-tender, non-distended MUSCULOSKELETAL:  No edema; No deformity  SKIN: Warm and dry NEUROLOGIC:  Alert and oriented x 3 PSYCHIATRIC:  Normal affect    Signed, Shirlee More, MD  06/09/2019 3:28 PM    [facility]

## 2019-06-09 ENCOUNTER — Encounter: Payer: Self-pay | Admitting: Cardiology

## 2019-06-09 ENCOUNTER — Other Ambulatory Visit: Payer: Self-pay

## 2019-06-09 ENCOUNTER — Ambulatory Visit (INDEPENDENT_AMBULATORY_CARE_PROVIDER_SITE_OTHER): Payer: BC Managed Care – PPO | Admitting: Cardiology

## 2019-06-09 VITALS — BP 122/82 | HR 86 | Ht 63.0 in | Wt 166.0 lb

## 2019-06-09 DIAGNOSIS — I493 Ventricular premature depolarization: Secondary | ICD-10-CM | POA: Diagnosis not present

## 2019-06-09 DIAGNOSIS — E782 Mixed hyperlipidemia: Secondary | ICD-10-CM | POA: Diagnosis not present

## 2019-06-09 DIAGNOSIS — I2510 Atherosclerotic heart disease of native coronary artery without angina pectoris: Secondary | ICD-10-CM | POA: Diagnosis not present

## 2019-06-09 MED ORDER — PRAVASTATIN SODIUM 20 MG PO TABS: 20.0000 mg | ORAL_TABLET | ORAL | 3 refills | Status: DC

## 2019-06-09 NOTE — Patient Instructions (Signed)
Medication Instructions:  Start: Pravastatin 20 MG 2 times a week   *If you need a refill on your cardiac medications before your next appointment, please call your pharmacy*  Lab Work: None  If you have labs (blood work) drawn today and your tests are completely normal, you will receive your results only by: Marland Kitchen MyChart Message (if you have MyChart) OR . A paper copy in the mail If you have any lab test that is abnormal or we need to change your treatment, we will call you to review the results.  Testing/Procedures: None  Follow-Up: At [facility], you and your health needs are our priority.  As part of our continuing mission to provide you with exceptional heart care, we have created designated Provider Care Teams.  These Care Teams include your primary Cardiologist (physician) and Advanced Practice Providers (APPs -  Physician Assistants and Nurse Practitioners) who all work together to provide you with the care you need, when you need it.  Your next appointment:   6 month(s)  The format for your next appointment:   Either In Person or Virtual  Provider:   Norman Herrlich, MD  Other Instructions None

## 2019-12-10 ENCOUNTER — Other Ambulatory Visit: Payer: Self-pay | Admitting: Cardiology

## 2019-12-10 DIAGNOSIS — I2510 Atherosclerotic heart disease of native coronary artery without angina pectoris: Secondary | ICD-10-CM

## 2019-12-10 MED ORDER — METOPROLOL TARTRATE 25 MG PO TABS: 37.5000 mg | ORAL_TABLET | Freq: Two times a day (BID) | ORAL | 3 refills | Status: DC

## 2019-12-10 NOTE — Telephone Encounter (Signed)
*  STAT* If patient is at the pharmacy, call can be transferred to refill team.   1. Which medications need to be refilled? (please list name of each medication and dose if known) metoprolol tartrate (LOPRESSOR) 25 MG tablet  2. Which pharmacy/location (including street and city if local pharmacy) is medication to be sent to? [facility] [address] [place], [place] - [address] [address]  3. Do they need a 30 day or 90 day supply? 90 day supply

## 2019-12-10 NOTE — Telephone Encounter (Signed)
Refill sent in per request.  

## 2019-12-11 ENCOUNTER — Other Ambulatory Visit: Payer: Self-pay | Admitting: Neurosurgery

## 2019-12-11 DIAGNOSIS — G935 Compression of brain: Secondary | ICD-10-CM

## 2020-01-12 ENCOUNTER — Other Ambulatory Visit: Payer: BC Managed Care – PPO

## 2020-02-13 DIAGNOSIS — T8859XA Other complications of anesthesia, initial encounter: Secondary | ICD-10-CM | POA: Insufficient documentation

## 2020-02-13 DIAGNOSIS — M542 Cervicalgia: Secondary | ICD-10-CM | POA: Insufficient documentation

## 2020-02-13 DIAGNOSIS — E7800 Pure hypercholesterolemia, unspecified: Secondary | ICD-10-CM | POA: Insufficient documentation

## 2020-02-13 DIAGNOSIS — L659 Nonscarring hair loss, unspecified: Secondary | ICD-10-CM | POA: Insufficient documentation

## 2020-02-13 DIAGNOSIS — N952 Postmenopausal atrophic vaginitis: Secondary | ICD-10-CM | POA: Insufficient documentation

## 2020-02-13 DIAGNOSIS — M79609 Pain in unspecified limb: Secondary | ICD-10-CM | POA: Insufficient documentation

## 2020-02-13 DIAGNOSIS — M21619 Bunion of unspecified foot: Secondary | ICD-10-CM | POA: Insufficient documentation

## 2020-02-15 NOTE — Progress Notes (Signed)
Cardiology Office Note:    Date:  02/16/2020   ID:  Jessica Francis, DOB [birth year], MRN [id number]  PCP:  Paulina Fusi, MD  Cardiologist:  Norman Herrlich, MD    Referring MD: Paulina Fusi, MD    ASSESSMENT:    1. Mild CAD   2. Mixed hyperlipidemia   3. Ventricular premature beats   4. Premature atrial contraction   5. Pneumonia due to COVID-19 virus    PLAN:    In order of problems listed above:  1. Stable continue medical therapy resume statin 2. Check liver function unless abnormal resume statin 3. Stable care for atrial and ventricular premature beats 4. Being followed by pulmonary and her PCP she is beginning to improve   Next appointment: 1 year   Medication Adjustments/Labs and Tests Ordered: Current medicines are reviewed at length with the patient today.  Concerns regarding medicines are outlined above.  No orders of the defined types were placed in this encounter.  No orders of the defined types were placed in this encounter.   Chief Complaint  Patient presents with  . Follow-up  . Coronary Artery Disease    History of Present Illness:    Jessica Francis is a 59 y.o. female with a hx of mild CAD, chest pain,palpitations with frequent PVC's and PAC's, asthma, GERD last seen 06/09/2019.  Compliance with diet, lifestyle and medications: Yes  Cardiac CTA 05/28/2019 shows elevated score 359 which is 97th percentile for age and sex normal coronary origin and numbness moderate obstructive disease in the left circumflex coronary artery 50 to 69% with subsequent normal fractional flow reserve consistent with nonobstructive CAD.   She admitted to [facility] health 0 01/02/2020 with COVID-19 pneumonia respiratory failure sinus tachycardia hyponatremia and abnormal liver function test.  He had significant respiratory failure and was treated with Actemra as an inpatient and discharged on home oxygen with pulmonary follow-up.  Hemoglobin 12.1 creatinine 0.60  C-reactive protein was severely elevated at 30.1.  Her chest x-ray showed bilateral pulmonary infiltrates.  EKG 12/19/2019 independently reviewed sinus rhythm normal EKG visual interpretation of her EKG showed increasing bilateral airspace disease right greater than left consistent with COVID-19 pneumonia.  She is finally beginning to feel better she is off oxygen is the first time she has been the home but she still fatigues easily.  Initially leaving the hospital her heart rate be rapid and she had sats of less than 80% on the morning off oxygen.  Sats now run greater than 90% no chest pain palpitation or syncope.  The hospitalist told her to stop her statin in the hospital we will check a profile CMP and have her resume her statin.  She will follow-up lipid profile with her PCP. Past Medical History:  Diagnosis Date  . Acute cholecystitis with chronic cholecystitis 03/06/2016  . Alopecia   . Asthma   . Biliary dyskinesia 03/02/2016  . Bunion   . CAD in native artery 03/15/2013  . Cardiac dysrhythmia, unspecified   . 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 Cervicalgia   . Chest pain 04/21/2019  . Cholecystitis, chronic 03/02/2016  . Complication of anesthesia    hard to wake up  . Conductive hearing loss in right ear 04/05/2015  . Coronary artery calcification seen on CT scan 04/26/2015   Overview:  Normal stress MPS at 7.8 mets Sept 2016 EF 60%  . Diarrhea in adult patient 01/25/2015  . Erosive gastritis 03/02/2016  .  Headache(784.0)   . 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 History of shingles 01/25/2015   Overview:  In 10/2014; had full therapy; right scapula area  . Hyperlipidemia 04/07/2013  . Left arm pain 02/23/2015  . Lumbosacral spondylosis 04/26/2015  . Lung mass 04/07/2013   Chronic scarring left upper lobe does not represent a malignant process and does NOT NEED  further imaging as has not changed since the year 2000. Pt is  aware and Radiology has issued a supplement report indicating no changes.  Overview:  Overview:  Chronic scarring left upper lobe does not represent a malignant process and does NOT NEED  further imaging as has not changed since the year 2000. Pt is aware and Radiology has issued a supplement report indicating no changes.  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 Mild persistent asthma without complication 01/25/2015  . Moderate persistent asthma 04/07/2013  . Overweight (BMI 25.0-29.9) 12/11/2017  . Pain in limb   . Postmenopausal atrophic vaginitis   . Premature atrial contraction 02/23/2015  . Pure hypercholesterolemia   . Unspecified sinusitis (chronic)   . Ventricular premature beats 02/23/2015   Overview:  With hypokalemia    Past Surgical History:  Procedure Laterality Date  . CHOLECYSTECTOMY    . HARDWARE REMOVAL  05/04/2011   Procedure: HARDWARE REMOVAL;  Surgeon: Wyn Forster., MD;  Location: [facility];  Service: Orthopedics;  Laterality: Left;  DVR plate removal left wrist,  . INNER EAR SURGERY     Perforated eardrum  . SHOULDER ARTHROSCOPY  1997   lt  . TUBAL LIGATION    . WRIST ARTHROPLASTY  9/12   lt -[facility]    Current Medications: Current Meds  Medication Sig  . aspirin 81 MG tablet Take 81 mg by mouth daily.  . cholecalciferol (VITAMIN D) 1000 UNITS tablet Take 1,000 Units by mouth daily.    Marland Kitchen dexamethasone (DECADRON) 1 MG tablet Take 1 mg by mouth daily.   Marland Kitchen estradiol (ESTRACE) 0.1 MG/GM vaginal cream  SMARTSIG:0.5 Gram(s) Vaginal 3 Times Daily  . FLOVENT HFA 110 MCG/ACT inhaler Inhale 2 puffs into the lungs in the morning and at bedtime.  . fluticasone (FLONASE) 50 MCG/ACT nasal spray Place 2 sprays into the nose daily.    . metoprolol tartrate (LOPRESSOR) 25 MG tablet Take 1.5 tablets (37.5 mg total) by mouth 2 (two) times daily.  . nitroGLYCERIN (NITROSTAT) 0.4 MG SL tablet Place 1 tablet (0.4 mg total) under the tongue every 5 (five) minutes as needed for chest pain.  . pantoprazole (PROTONIX) 40 MG tablet Take 40 mg by mouth daily.  . [DISCONTINUED] esomeprazole (NEXIUM) 40 MG capsule TAKE 1 CAPSULE BY MOUTH ONCE DAILY IN THE MORNING 30 MINUTES BEFORE BREAKFAST     Allergies:   Iodine, Metrizamide, Prednisone, Contrast media [iodinated diagnostic agents], Doxycycline, and Iohexol   Social History   Socioeconomic History  . Marital status: Married    Spouse name: Not on file  . Number  of children: 2  . Years of education: Not on file  . Highest education level: Not on file  Occupational History    Employer: [employer]  Tobacco Use  . Smoking status: Never Smoker  . Smokeless tobacco: Never Used  Vaping Use  . Vaping Use: Never used  Substance and Sexual Activity  . Alcohol use: No    Alcohol/week: 0.0 standard drinks  . Drug use: No  . Sexual activity: Not on file  Other Topics Concern  . Not on file  Social History Narrative   Lives at home with husband and dog.    Social Determinants of Health   Financial Resource Strain:   . Difficulty of Paying Living Expenses: Not on file  Food Insecurity:   . Worried About Programme researcher, broadcasting/film/video in the Last Year: Not on file  . Ran Out of Food in the Last Year: Not on file  Transportation Needs:   . Lack of Transportation (Medical): Not on file  . Lack of Transportation (Non-Medical): Not on file  Physical Activity:   . Days of Exercise per Week: Not on file  . Minutes of Exercise per Session: Not on file  Stress:   . Feeling  of Stress : Not on file  Social Connections:   . Frequency of Communication with Friends and Family: Not on file  . Frequency of Social Gatherings with Friends and Family: Not on file  . Attends Religious Services: Not on file  . Active Member of Clubs or Organizations: Not on file  . Attends Banker Meetings: Not on file  . Marital Status: Not on file     Family History: The patient's family history includes Arthritis in her mother; Asthma in her maternal grandfather; CAD (age of onset: 54) in her brother; CAD (age of onset: 66) in her sister; CAD (age of onset: 38) in her mother; Cancer - Lung in her father; Diabetes in her brother and brother; Heart disease in her father; Parkinsonism in her mother. ROS:   Please see the history of present illness.    All other systems reviewed and are negative.  EKGs/Labs/Other Studies Reviewed:    The following studies were reviewed today:   Physical Exam:    VS:  BP 109/70   Pulse 92   Ht 5\' 3"  (1.6 m)   Wt 173 lb 6.4 oz (78.7 kg)   SpO2 97%   BMI 30.72 kg/m     Wt Readings from Last 3 Encounters:  02/16/20 173 lb 6.4 oz (78.7 kg)  06/09/19 166 lb (75.3 kg)  04/21/19 165 lb 1.9 oz (74.9 kg)     GEN: She looks washed out well nourished, well developed in no acute distress HEENT: Normal NECK: No JVD; No carotid bruits LYMPHATICS: No lymphadenopathy CARDIAC: RRR, no murmurs, rubs, gallops RESPIRATORY:  Clear to auscultation without rales, wheezing or rhonchi  ABDOMEN: Soft, non-tender, non-distended MUSCULOSKELETAL:  No edema; No deformity  SKIN: Warm and dry NEUROLOGIC:  Alert and oriented x 3 PSYCHIATRIC:  Normal affect    Signed, 04/23/19, MD  02/16/2020 3:48 PM    [facility]

## 2020-02-16 ENCOUNTER — Ambulatory Visit (INDEPENDENT_AMBULATORY_CARE_PROVIDER_SITE_OTHER): Payer: BC Managed Care – PPO | Admitting: Cardiology

## 2020-02-16 ENCOUNTER — Encounter: Payer: Self-pay | Admitting: Cardiology

## 2020-02-16 ENCOUNTER — Other Ambulatory Visit: Payer: Self-pay

## 2020-02-16 VITALS — BP 109/70 | HR 92 | Ht 63.0 in | Wt 173.4 lb

## 2020-02-16 DIAGNOSIS — U071 COVID-19: Secondary | ICD-10-CM

## 2020-02-16 DIAGNOSIS — E782 Mixed hyperlipidemia: Secondary | ICD-10-CM | POA: Diagnosis not present

## 2020-02-16 DIAGNOSIS — I491 Atrial premature depolarization: Secondary | ICD-10-CM

## 2020-02-16 DIAGNOSIS — I2510 Atherosclerotic heart disease of native coronary artery without angina pectoris: Secondary | ICD-10-CM

## 2020-02-16 DIAGNOSIS — J1282 Pneumonia due to coronavirus disease 2019: Secondary | ICD-10-CM

## 2020-02-16 DIAGNOSIS — I493 Ventricular premature depolarization: Secondary | ICD-10-CM | POA: Diagnosis not present

## 2020-02-16 MED ORDER — PRAVASTATIN SODIUM 20 MG PO TABS: 20.0000 mg | ORAL_TABLET | ORAL | 3 refills | Status: DC

## 2020-02-16 NOTE — Patient Instructions (Addendum)
Medication Instructions:  Your physician has recommended you make the following change in your medication:  1.  RESUME the Pravastatin 20 mg taking 2 X's a week . *If you need a refill on your cardiac medications before your next appointment, please call your pharmacy*   Lab Work: TODAY:  CMP  If you have labs (blood work) drawn today and your tests are completely normal, you will receive your results only by: Marland Kitchen MyChart Message (if you have MyChart) OR . A paper copy in the mail If you have any lab test that is abnormal or we need to change your treatment, we will call you to review the results.   Testing/Procedures: None ordered   Follow-Up: At [facility], you and your health needs are our priority.  As part of our continuing mission to provide you with exceptional heart care, we have created designated Provider Care Teams.  These Care Teams include your primary Cardiologist (physician) and Advanced Practice Providers (APPs -  Physician Assistants and Nurse Practitioners) who all work together to provide you with the care you need, when you need it.  We recommend signing up for the patient portal called "MyChart".  Sign up information is provided on this After Visit Summary.  MyChart is used to connect with patients for Virtual Visits (Telemedicine).  Patients are able to view lab/test results, encounter notes, upcoming appointments, etc.  Non-urgent messages can be sent to your provider as well.   To learn more about what you can do with MyChart, go to [email or web address].    Your next appointment:   12 month(s)  The format for your next appointment:   In Person  Provider:   Norman Herrlich, MD   Other Instructions

## 2020-02-17 LAB — COMPREHENSIVE METABOLIC PANEL
ALT: 49 IU/L — ABNORMAL HIGH (ref 0–32)
AST: 20 IU/L (ref 0–40)
Albumin/Globulin Ratio: 1.9 (ref 1.2–2.2)
Albumin: 4.1 g/dL (ref 3.8–4.9)
Alkaline Phosphatase: 80 IU/L (ref 44–121)
BUN/Creatinine Ratio: 22 (ref 9–23)
BUN: 19 mg/dL (ref 6–24)
Bilirubin Total: 0.2 mg/dL (ref 0.0–1.2)
CO2: 25 mmol/L (ref 20–29)
Calcium: 9.2 mg/dL (ref 8.7–10.2)
Chloride: 104 mmol/L (ref 96–106)
Creatinine, Ser: 0.86 mg/dL (ref 0.57–1.00)
GFR calc Af Amer: 86 mL/min/{1.73_m2} (ref >59)
GFR calc non Af Amer: 74 mL/min/{1.73_m2} (ref >59)
Globulin, Total: 2.2 g/dL (ref 1.5–4.5)
Glucose: 132 mg/dL — ABNORMAL HIGH (ref 65–99)
Potassium: 4.4 mmol/L (ref 3.5–5.2)
Sodium: 140 mmol/L (ref 134–144)
Total Protein: 6.3 g/dL (ref 6.0–8.5)

## 2020-03-09 ENCOUNTER — Ambulatory Visit
Admission: RE | Admit: 2020-03-09 | Discharge: 2020-03-09 | Disposition: A | Discharge status: Home / Self Care | Payer: BC Managed Care – PPO | Source: Ambulatory Visit | Attending: Neurosurgery | Admitting: Neurosurgery

## 2020-03-09 DIAGNOSIS — G935 Compression of brain: Secondary | ICD-10-CM

## 2020-04-06 IMAGING — CT CT HEART MORP W/ CTA COR W/ SCORE W/ CA W/CM &/OR W/O CM
2 of 8 series · 4 of 20 positions shown, 5 images · IV contrast (APPLIED)
Comparison: None.
COMPARISON: None.

Addendum:
EXAM:
OVER-READ INTERPRETATION  CT CHEST

The following report is an over-read performed by radiologist Dr.
Kush Gundersen [[email or web address]] on 05/28/2019. This
over-read does not include interpretation of cardiac or coronary
anatomy or pathology. The coronary CTA interpretation by the
cardiologist is attached.
CLINICAL DATA: 58F with normal stress test and persistent chest
pain.
Cardiac/Coronary  CT
TECHNIQUE: The patient was scanned on a [employer] Force scanner.

[Series 9: ts syst sharp 45 % · axial · 0.39mm/px · z∈[+1090,+1132]mm · 2 of 318 slices shown]
[im 106/318  lung]
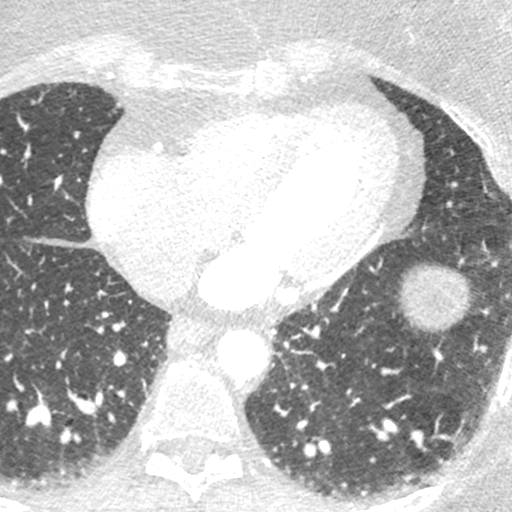
[im 212/318  lung]
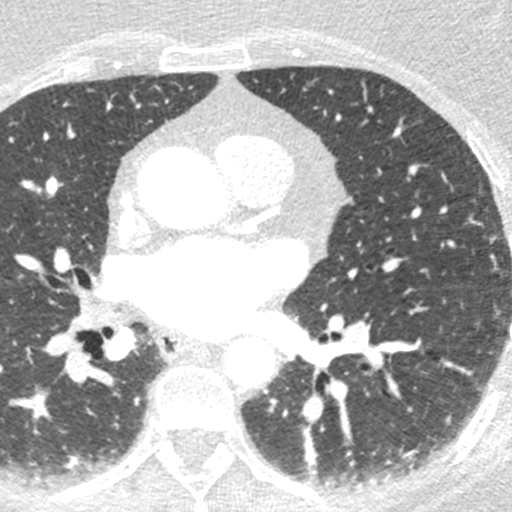

[Series 13: best syst · axial · 0.67mm/px · z∈[+1090,+1132]mm · 2 of 318 slices shown, 3 images]
[im 106/318  vessel]
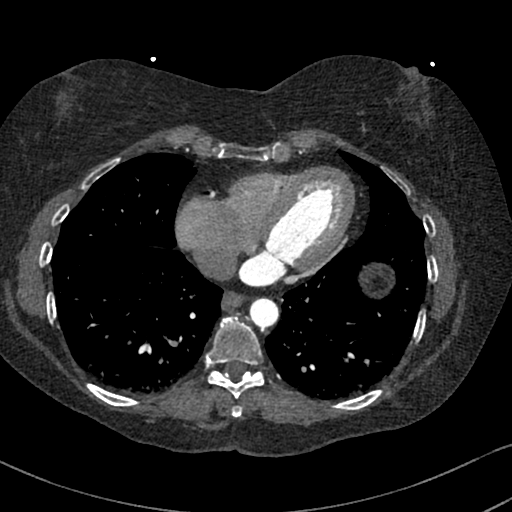
[im 106/318  lung]
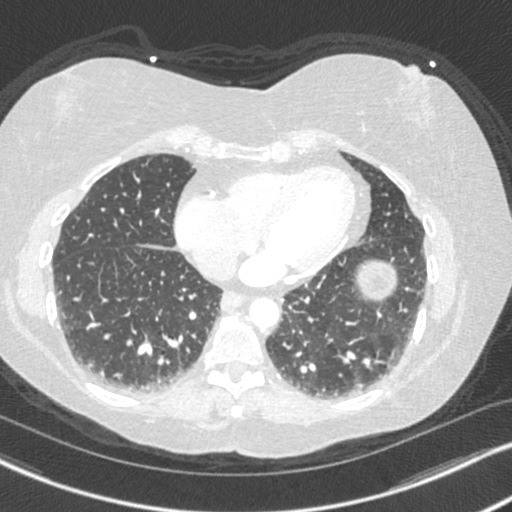
[im 212/318  vessel]
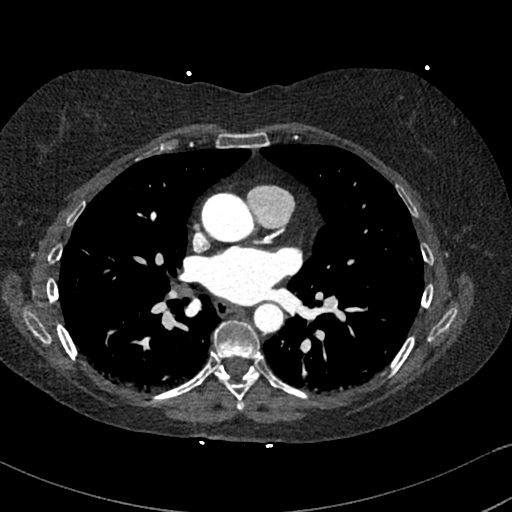

[4 of 20 positions shown; findings below may reference images not displayed]

FINDINGS: Vascular: Heart is normal size.  Visualized aorta normal caliber.

Mediastinum/Nodes: No adenopathy in the lower mediastinum or hila.

Lungs/Pleura: No confluent opacities or effusions.

Upper Abdomen: Imaging into the upper abdomen shows no acute
findings.

Musculoskeletal: Chest wall soft tissues are unremarkable. No acute
bony abnormality.
IMPRESSION: No acute or significant extracardiac abnormality.
FINDINGS: A 120 kV prospective scan was triggered in the descending thoracic
aorta at 111 HU's. Axial non-contrast 3 mm slices were carried out
through the heart. The data set was analyzed on a dedicated work
station and scored using the Agatson method. Gantry rotation speed
was 250 msecs and collimation was .6 mm. No beta blockade and 0.8 mg
of sl NTG was given. The 3D data set was reconstructed in 5%
intervals of the 67-82 % of the R-R cycle. Diastolic phases were
analyzed on a dedicated work station using MPR, MIP and VRT modes.
The patient received 80 cc of contrast.

Aorta: Normal size. Ascending aorta 3.0 cm. No calcifications. No
dissection.

Aortic Valve:  Trileaflet.  No calcifications.

Coronary Arteries:  Normal coronary origin.  Right dominance.

RCA is a large dominant artery that gives rise to PDA and two GILLY
branches. There is a long segment of non-obstructing (<25%)
calcified plaque just proximal to the PDA.

Left main is a large artery that gives rise to LAD and LCX arteries.

LAD has mild (25-49%) calcified plaque at the ostium. There is mild
(25-49%) soft plaque in the mid LAD. There is a small D1 that is
heavily calcified at the ostium.

LCX is a non-dominant artery that gives rise to one large OM1
branch. There is a large calcified plaque in the proximal LCX that
is moderately obstructive (50-69%).

Other findings:

Normal pulmonary vein drainage into the left atrium.

Normal let atrial appendage without a thrombus.

Normal size of the pulmonary artery.
IMPRESSION: 1. Coronary calcium score of 359. This was 97th percentile for age
and sex matched control.

2. Normal coronary origin with right dominance.

3.  There is CAD-RADS 3 (moderate) obstruction in [place] LCX.

4.  Will send for FFRct.

5. Recommend aggressive risk factor modification including
high-potency statin.

*** End of Addendum ***
EXAM:
OVER-READ INTERPRETATION  CT CHEST

The following report is an over-read performed by radiologist Dr.
Kush Gundersen [REDACTED] on 05/28/2019. This
over-read does not include interpretation of cardiac or coronary
anatomy or pathology. The coronary CTA interpretation by the
cardiologist is attached.
FINDINGS: Vascular: Heart is normal size.  Visualized aorta normal caliber.

Mediastinum/Nodes: No adenopathy in the lower mediastinum or hila.

Lungs/Pleura: No confluent opacities or effusions.

Upper Abdomen: Imaging into the upper abdomen shows no acute
findings.

Musculoskeletal: Chest wall soft tissues are unremarkable. No acute
bony abnormality.
IMPRESSION: No acute or significant extracardiac abnormality.

## 2021-03-20 NOTE — Progress Notes (Signed)
Cardiology Office Note:    Date:  03/21/2021   ID:  Jessica Francis, DOB Sep 12, 1960, MRN [id number]  PCP:  Paulina Fusi, MD  Cardiologist:  Norman Herrlich, MD    Referring MD: Paulina Fusi, MD    ASSESSMENT:    1. Coronary artery disease involving native coronary artery of native heart, unspecified whether angina present   2. Agatston coronary artery calcium score between 200 and 399   3. Mixed hyperlipidemia   4. Ventricular premature beats    PLAN:    In order of problems listed above:  Clinically stable however she has a significant family history at risk for LP(a) excess we will check with next lipid profile and intensified lipid-lowering adding Zetia to rosuvastatin and if LDL remains greater than 55 would be appropriate to use a PCSK9 inhibitor.  At this time I do not think she requires an ischemic evaluation and will continue aspirin beta-blocker and lipid-lowering. Stable PVCs not having symptoms and not present on the EKG today continue beta-blocker   Next appointment: 1 year   Medication Adjustments/Labs and Tests Ordered: Current medicines are reviewed at length with the patient today.  Concerns regarding medicines are outlined above.  Orders Placed This Encounter  Procedures   Lipid panel   EKG 12-Lead    Meds ordered this encounter  Medications   ezetimibe (ZETIA) 10 MG tablet    Sig: Take 1 tablet (10 mg total) by mouth daily.    Dispense:  90 tablet    Refill:  3     Chief Complaint  Patient presents with   Follow-up   Coronary Artery Disease     History of Present Illness:    Jessica Francis is a 60 y.o. female with a hx of mild CAD with a very elevated coronary calcium score 359 97th percentile 05/28/2019 and left circumflex 50 to 69% stenosis with normal fractional flow last seen 02/16/2020 Compliance with diet, lifestyle and medications: Yes  Her brother recently had CABG [facility]. From a cardiology perspective doing  well no chest pain shortness of breath palpitation or syncope. She tolerates her statin without muscle pain or weakness but her LDL residual is quite high at 120 and to optimize care will add Zetia recheck lipids in about 6 weeks and if needed start a PCSK9 inhibitor. 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   Cervicalgia    Chest pain 04/21/2019   Cholecystitis, chronic 03/02/2016   Complication of anesthesia    hard to wake up   Conductive hearing loss in right ear 04/05/2015   Coronary artery calcification seen on CT scan 04/26/2015   Overview:  Normal stress MPS at 7.8 mets Sept 2016 EF 60%   Diarrhea in adult patient 01/25/2015   Erosive gastritis 03/02/2016   Headache(784.0)    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 Pt is aware and Radiology has issued a supplement report indicating no changes.  Overview:  Overview:  Chronic scarring left upper lobe does not represent a malignant process and does NOT NEED  further imaging as has not changed since the year 2000. Pt is aware and Radiology has issued a supplement report indicating no changes.  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 represents the nodule is described on  July 2014 CT scan of chest After reviewing prior x-ray studies going back to the year 2000 this area of scarring has not changed in a 14 year interval of time  Plan No additional imaging studies are necessary Pulmonary followup is as needed I will have radiology compare the most recent CT scan of chest with CT scan of neck which shows the upper chest area from 2009   Mild persistent asthma without complication 01/25/2015   Moderate persistent asthma 04/07/2013   Overweight (BMI 25.0-29.9) 12/11/2017   Pain in limb    Postmenopausal atrophic vaginitis    Premature atrial contraction 02/23/2015   Pure hypercholesterolemia    Unspecified sinusitis (chronic)    Ventricular premature beats 02/23/2015   Overview:  With hypokalemia    Past Surgical History:  Procedure Laterality Date   CHOLECYSTECTOMY     HARDWARE REMOVAL  05/04/2011   Procedure: HARDWARE REMOVAL;  Surgeon: Wyn Forster., MD;  Location: [facility];  Service: Orthopedics;  Laterality: Left;  DVR plate removal left wrist,   INNER EAR SURGERY     Perforated eardrum   SHOULDER ARTHROSCOPY  1997   lt   TUBAL LIGATION     WRIST ARTHROPLASTY  9/12   lt -[facility]    Current Medications: Current Meds  Medication Sig   aspirin 81 MG tablet Take 81 mg by mouth daily.   cholecalciferol (VITAMIN D) 1000 UNITS tablet Take 1,000 Units by mouth daily.     cycloSPORINE (RESTASIS) 0.05 % ophthalmic emulsion 1 drop in the morning and at bedtime.   estradiol (ESTRACE) 0.1 MG/GM vaginal cream SMARTSIG:0.5 Gram(s) Vaginal 3 Times Daily   ezetimibe (ZETIA) 10 MG tablet Take 1 tablet (10 mg total) by mouth daily.   fluticasone (FLONASE) 50 MCG/ACT nasal spray Place 2 sprays into the nose daily.     Magnesium 500 MG CAPS Take 500 mg by mouth daily.   metoprolol tartrate (LOPRESSOR) 25 MG tablet Take 1.5 tablets (37.5 mg total) by mouth 2 (two) times daily.   montelukast (SINGULAIR) 5 MG chewable tablet Chew 5 mg by  mouth daily.   nitroGLYCERIN (NITROSTAT) 0.4 MG SL tablet Place 1 tablet (0.4 mg total) under the tongue every 5 (five) minutes as needed for chest pain.   QVAR REDIHALER 80 MCG/ACT inhaler Inhale 2 puffs into the lungs in the morning and at bedtime.   rosuvastatin (CRESTOR) 5 MG tablet Take 5 mg by mouth daily.     Allergies:   Iodine, Metrizamide, Prednisone, Contrast media [iodinated diagnostic agents], Doxycycline, and Iohexol   Social History   Socioeconomic History   Marital status: Married    Spouse name: Not on file   Number of children: 2   Years of education: Not on file   Highest education level: Not on file  Occupational History    Employer:  [employer]  Tobacco Use   Smoking status: Never   Smokeless tobacco: Never  Vaping Use   Vaping Use: Never used  Substance and Sexual Activity   Alcohol use: No    Alcohol/week: 0.0 standard drinks   Drug use: No   Sexual activity: Not on file  Other Topics Concern   Not on file  Social History Narrative   Lives at home with husband and dog.    Social Determinants of Corporate investment banker Strain: Not on file  Food Insecurity: Not on file  Transportation Needs: Not on file  Physical Activity: Not on file  Stress: Not on file  Social Connections: Not on file     Family History: The patient's family history includes Arthritis in her mother; Asthma in her maternal grandfather; CAD (age of onset: 76) in her brother; CAD (age of onset: 39) in her sister; CAD (age of onset: 51) in her mother; Cancer - Lung in her father; Diabetes in her brother and brother; Heart disease in her father; Parkinsonism in her mother. ROS:   Please see the history of present illness.    All other systems reviewed and are negative.  EKGs/Labs/Other Studies Reviewed:    The following studies were reviewed today:  EKG:  EKG ordered today and personally reviewed.  The ekg ordered today demonstrates sinus rhythm and remains normal  Recent  Labs: 03/14/2021 cholesterol 199 HDL 68 triglycerides 58 LDL residual is 120 hemoglobin 13.5 creatinine 0.8 potassium 4.5  Physical Exam:    VS:  BP 110/76   Pulse (!) 58   Ht 5\' 3"  (1.6 m)   Wt 172 lb 12.8 oz (78.4 kg)   SpO2 99%   BMI 30.61 kg/m     Wt Readings from Last 3 Encounters:  03/21/21 172 lb 12.8 oz (78.4 kg)  02/16/20 173 lb 6.4 oz (78.7 kg)  06/09/19 166 lb (75.3 kg)     GEN:  Well nourished, well developed in no acute distress HEENT: Normal NECK: No JVD; No carotid bruits LYMPHATICS: No lymphadenopathy CARDIAC: RRR, no murmurs, rubs, gallops RESPIRATORY:  Clear to auscultation without rales, wheezing or rhonchi  ABDOMEN: Soft, non-tender, non-distended MUSCULOSKELETAL:  No edema; No deformity  SKIN: Warm and dry NEUROLOGIC:  Alert and oriented x 3 PSYCHIATRIC:  Normal affect    Signed, 08/07/19, MD  03/21/2021 8:30 AM    [facility]

## 2021-03-21 ENCOUNTER — Ambulatory Visit (INDEPENDENT_AMBULATORY_CARE_PROVIDER_SITE_OTHER): Payer: BC Managed Care – PPO | Admitting: Cardiology

## 2021-03-21 ENCOUNTER — Encounter: Payer: Self-pay | Admitting: Cardiology

## 2021-03-21 ENCOUNTER — Other Ambulatory Visit: Payer: Self-pay

## 2021-03-21 VITALS — BP 110/76 | HR 58 | Ht 63.0 in | Wt 172.8 lb

## 2021-03-21 DIAGNOSIS — I2510 Atherosclerotic heart disease of native coronary artery without angina pectoris: Secondary | ICD-10-CM

## 2021-03-21 DIAGNOSIS — R931 Abnormal findings on diagnostic imaging of heart and coronary circulation: Secondary | ICD-10-CM

## 2021-03-21 DIAGNOSIS — I493 Ventricular premature depolarization: Secondary | ICD-10-CM | POA: Diagnosis not present

## 2021-03-21 DIAGNOSIS — E782 Mixed hyperlipidemia: Secondary | ICD-10-CM | POA: Diagnosis not present

## 2021-03-21 MED ORDER — EZETIMIBE 10 MG PO TABS: 10.0000 mg | ORAL_TABLET | Freq: Every day | ORAL | 3 refills | Status: DC

## 2021-03-21 NOTE — Patient Instructions (Signed)
Medication Instructions:  Your physician has recommended you make the following change in your medication:  START: Zetia 10 mg take one tablet by mouth daily.  *If you need a refill on your cardiac medications before your next appointment, please call your pharmacy*   Lab Work: Your physician recommends that you return for lab work in: 6 weeks FASTING- Lipids If you have labs (blood work) drawn today and your tests are completely normal, you will receive your results only by: MyChart Message (if you have MyChart) OR A paper copy in the mail If you have any lab test that is abnormal or we need to change your treatment, we will call you to review the results.   Testing/Procedures: None   Follow-Up: At [facility], you and your health needs are our priority.  As part of our continuing mission to provide you with exceptional heart care, we have created designated Provider Care Teams.  These Care Teams include your primary Cardiologist (physician) and Advanced Practice Providers (APPs -  Physician Assistants and Nurse Practitioners) who all work together to provide you with the care you need, when you need it.  We recommend signing up for the patient portal called "MyChart".  Sign up information is provided on this After Visit Summary.  MyChart is used to connect with patients for Virtual Visits (Telemedicine).  Patients are able to view lab/test results, encounter notes, upcoming appointments, etc.  Non-urgent messages can be sent to your provider as well.   To learn more about what you can do with MyChart, go to [email or web address].    Your next appointment:   1 year(s)  The format for your next appointment:   In Person  Provider:   Norman Herrlich, MD   Other Instructions

## 2021-06-06 DIAGNOSIS — J84112 Idiopathic pulmonary fibrosis: Secondary | ICD-10-CM | POA: Diagnosis not present

## 2021-06-06 DIAGNOSIS — J4530 Mild persistent asthma, uncomplicated: Secondary | ICD-10-CM | POA: Diagnosis not present

## 2021-06-06 DIAGNOSIS — Z8616 Personal history of COVID-19: Secondary | ICD-10-CM | POA: Diagnosis not present

## 2021-07-01 ENCOUNTER — Other Ambulatory Visit: Payer: Self-pay | Admitting: Specialist

## 2021-07-01 DIAGNOSIS — J84112 Idiopathic pulmonary fibrosis: Secondary | ICD-10-CM

## 2021-07-19 ENCOUNTER — Other Ambulatory Visit: Payer: BC Managed Care – PPO

## 2021-07-26 ENCOUNTER — Ambulatory Visit
Admission: RE | Admit: 2021-07-26 | Discharge: 2021-07-26 | Disposition: A | Discharge status: Home / Self Care | Payer: BC Managed Care – PPO | Source: Ambulatory Visit | Attending: Specialist | Admitting: Specialist

## 2021-07-26 ENCOUNTER — Other Ambulatory Visit: Payer: Self-pay

## 2021-07-26 DIAGNOSIS — J84112 Idiopathic pulmonary fibrosis: Secondary | ICD-10-CM

## 2021-07-26 DIAGNOSIS — J479 Bronchiectasis, uncomplicated: Secondary | ICD-10-CM | POA: Diagnosis not present

## 2021-07-26 DIAGNOSIS — I700 Atherosclerosis of aorta: Secondary | ICD-10-CM | POA: Diagnosis not present

## 2021-08-01 DIAGNOSIS — J4530 Mild persistent asthma, uncomplicated: Secondary | ICD-10-CM | POA: Diagnosis not present

## 2021-08-01 DIAGNOSIS — Z8616 Personal history of COVID-19: Secondary | ICD-10-CM | POA: Diagnosis not present

## 2021-08-01 DIAGNOSIS — J84112 Idiopathic pulmonary fibrosis: Secondary | ICD-10-CM | POA: Diagnosis not present

## 2021-08-01 DIAGNOSIS — J479 Bronchiectasis, uncomplicated: Secondary | ICD-10-CM | POA: Diagnosis not present

## 2021-09-09 DIAGNOSIS — J45909 Unspecified asthma, uncomplicated: Secondary | ICD-10-CM | POA: Diagnosis not present

## 2021-09-09 DIAGNOSIS — J309 Allergic rhinitis, unspecified: Secondary | ICD-10-CM | POA: Diagnosis not present

## 2021-09-09 DIAGNOSIS — R002 Palpitations: Secondary | ICD-10-CM | POA: Diagnosis not present

## 2021-09-09 DIAGNOSIS — Z1331 Encounter for screening for depression: Secondary | ICD-10-CM | POA: Diagnosis not present

## 2021-09-09 DIAGNOSIS — E785 Hyperlipidemia, unspecified: Secondary | ICD-10-CM | POA: Diagnosis not present

## 2021-10-20 DIAGNOSIS — M8589 Other specified disorders of bone density and structure, multiple sites: Secondary | ICD-10-CM | POA: Diagnosis not present

## 2021-10-20 DIAGNOSIS — Z1231 Encounter for screening mammogram for malignant neoplasm of breast: Secondary | ICD-10-CM | POA: Diagnosis not present

## 2021-10-31 DIAGNOSIS — J4530 Mild persistent asthma, uncomplicated: Secondary | ICD-10-CM | POA: Diagnosis not present

## 2021-10-31 DIAGNOSIS — J479 Bronchiectasis, uncomplicated: Secondary | ICD-10-CM | POA: Diagnosis not present

## 2021-10-31 DIAGNOSIS — J84112 Idiopathic pulmonary fibrosis: Secondary | ICD-10-CM | POA: Diagnosis not present

## 2021-10-31 DIAGNOSIS — Z8616 Personal history of COVID-19: Secondary | ICD-10-CM | POA: Diagnosis not present

## 2021-11-16 DIAGNOSIS — B86 Scabies: Secondary | ICD-10-CM | POA: Diagnosis not present

## 2022-02-06 DIAGNOSIS — J4530 Mild persistent asthma, uncomplicated: Secondary | ICD-10-CM | POA: Diagnosis not present

## 2022-02-06 DIAGNOSIS — J479 Bronchiectasis, uncomplicated: Secondary | ICD-10-CM | POA: Diagnosis not present

## 2022-02-06 DIAGNOSIS — J84112 Idiopathic pulmonary fibrosis: Secondary | ICD-10-CM | POA: Diagnosis not present

## 2022-02-06 DIAGNOSIS — Z8616 Personal history of COVID-19: Secondary | ICD-10-CM | POA: Diagnosis not present

## 2022-03-03 DIAGNOSIS — R300 Dysuria: Secondary | ICD-10-CM | POA: Diagnosis not present

## 2022-03-03 DIAGNOSIS — N3091 Cystitis, unspecified with hematuria: Secondary | ICD-10-CM | POA: Diagnosis not present

## 2022-03-13 DIAGNOSIS — E785 Hyperlipidemia, unspecified: Secondary | ICD-10-CM | POA: Diagnosis not present

## 2022-03-13 DIAGNOSIS — N390 Urinary tract infection, site not specified: Secondary | ICD-10-CM | POA: Diagnosis not present

## 2022-03-13 DIAGNOSIS — R002 Palpitations: Secondary | ICD-10-CM | POA: Diagnosis not present

## 2022-03-13 DIAGNOSIS — J45909 Unspecified asthma, uncomplicated: Secondary | ICD-10-CM | POA: Diagnosis not present

## 2022-08-07 ENCOUNTER — Other Ambulatory Visit: Payer: Self-pay | Admitting: Specialist

## 2022-08-07 DIAGNOSIS — J84112 Idiopathic pulmonary fibrosis: Secondary | ICD-10-CM

## 2022-08-07 DIAGNOSIS — J4530 Mild persistent asthma, uncomplicated: Secondary | ICD-10-CM | POA: Diagnosis not present

## 2022-08-07 DIAGNOSIS — J479 Bronchiectasis, uncomplicated: Secondary | ICD-10-CM | POA: Diagnosis not present

## 2022-08-07 DIAGNOSIS — Z8616 Personal history of COVID-19: Secondary | ICD-10-CM | POA: Diagnosis not present

## 2022-09-08 ENCOUNTER — Ambulatory Visit
Admission: RE | Admit: 2022-09-08 | Discharge: 2022-09-08 | Disposition: A | Discharge status: Home / Self Care | Payer: BC Managed Care – PPO | Source: Ambulatory Visit | Attending: Specialist | Admitting: Specialist

## 2022-09-08 DIAGNOSIS — J84112 Idiopathic pulmonary fibrosis: Secondary | ICD-10-CM

## 2022-09-08 DIAGNOSIS — I700 Atherosclerosis of aorta: Secondary | ICD-10-CM | POA: Diagnosis not present

## 2022-09-11 DIAGNOSIS — M8589 Other specified disorders of bone density and structure, multiple sites: Secondary | ICD-10-CM | POA: Diagnosis not present

## 2022-09-11 DIAGNOSIS — J45909 Unspecified asthma, uncomplicated: Secondary | ICD-10-CM | POA: Diagnosis not present

## 2022-09-11 DIAGNOSIS — R002 Palpitations: Secondary | ICD-10-CM | POA: Diagnosis not present

## 2022-09-11 DIAGNOSIS — J8410 Pulmonary fibrosis, unspecified: Secondary | ICD-10-CM | POA: Diagnosis not present

## 2022-09-11 DIAGNOSIS — J479 Bronchiectasis, uncomplicated: Secondary | ICD-10-CM | POA: Diagnosis not present

## 2022-09-11 DIAGNOSIS — J4530 Mild persistent asthma, uncomplicated: Secondary | ICD-10-CM | POA: Diagnosis not present

## 2022-09-11 DIAGNOSIS — E785 Hyperlipidemia, unspecified: Secondary | ICD-10-CM | POA: Diagnosis not present

## 2022-09-11 DIAGNOSIS — Z8616 Personal history of COVID-19: Secondary | ICD-10-CM | POA: Diagnosis not present

## 2022-09-25 DIAGNOSIS — J8410 Pulmonary fibrosis, unspecified: Secondary | ICD-10-CM | POA: Diagnosis not present

## 2022-09-25 DIAGNOSIS — Z8616 Personal history of COVID-19: Secondary | ICD-10-CM | POA: Diagnosis not present

## 2022-10-16 DIAGNOSIS — S32050A Wedge compression fracture of fifth lumbar vertebra, initial encounter for closed fracture: Secondary | ICD-10-CM | POA: Diagnosis not present

## 2022-10-16 DIAGNOSIS — S22050A Wedge compression fracture of T5-T6 vertebra, initial encounter for closed fracture: Secondary | ICD-10-CM | POA: Diagnosis not present

## 2022-10-16 DIAGNOSIS — M549 Dorsalgia, unspecified: Secondary | ICD-10-CM | POA: Diagnosis not present

## 2022-10-17 ENCOUNTER — Other Ambulatory Visit: Payer: Self-pay | Admitting: *Deleted

## 2022-10-17 DIAGNOSIS — S22050A Wedge compression fracture of T5-T6 vertebra, initial encounter for closed fracture: Secondary | ICD-10-CM

## 2022-10-30 DIAGNOSIS — Z1231 Encounter for screening mammogram for malignant neoplasm of breast: Secondary | ICD-10-CM | POA: Diagnosis not present

## 2022-11-02 ENCOUNTER — Encounter: Payer: Self-pay | Admitting: Orthopaedic Surgery

## 2022-11-04 DIAGNOSIS — S60511A Abrasion of right hand, initial encounter: Secondary | ICD-10-CM | POA: Diagnosis not present

## 2022-11-04 DIAGNOSIS — S60940A Unspecified superficial injury of right index finger, initial encounter: Secondary | ICD-10-CM | POA: Diagnosis not present

## 2022-11-05 ENCOUNTER — Ambulatory Visit
Admission: RE | Admit: 2022-11-05 | Discharge: 2022-11-05 | Disposition: A | Discharge status: Home / Self Care | Payer: BC Managed Care – PPO | Source: Ambulatory Visit | Attending: *Deleted | Admitting: *Deleted

## 2022-11-05 DIAGNOSIS — M47816 Spondylosis without myelopathy or radiculopathy, lumbar region: Secondary | ICD-10-CM | POA: Diagnosis not present

## 2022-11-05 DIAGNOSIS — S22080A Wedge compression fracture of T11-T12 vertebra, initial encounter for closed fracture: Secondary | ICD-10-CM | POA: Diagnosis not present

## 2022-11-05 DIAGNOSIS — S22050A Wedge compression fracture of T5-T6 vertebra, initial encounter for closed fracture: Secondary | ICD-10-CM

## 2022-11-13 DIAGNOSIS — S22050A Wedge compression fracture of T5-T6 vertebra, initial encounter for closed fracture: Secondary | ICD-10-CM | POA: Diagnosis not present

## 2022-12-26 ENCOUNTER — Encounter: Payer: Self-pay | Admitting: *Deleted

## 2022-12-26 NOTE — Progress Notes (Unsigned)
Cardiology Office Note:    Date:  12/27/2022   ID:  NOLA MOHS, DOB [birth year], MRN [id number]  PCP:  Paulina Fusi, MD  Cardiologist:  Norman Herrlich, MD    Referring MD: Paulina Fusi, MD    ASSESSMENT:    1. Coronary artery disease involving native coronary artery of native heart, unspecified whether angina present   2. Agatston coronary artery calcium score between 200 and 399   3. Mixed hyperlipidemia   4. Ventricular premature beats    PLAN:    In order of problems listed above:  Plan stress echocardiogram with her CAD symptoms if abnormal would favor coronary angiography. She has a pending lipid profile coming up on a higher dose statin I suspect her LDL remains greater than 70 ideally I think she should be 50 or less and may benefit from coincident therapy with Repatha plus her high intensity statin Holter monitor assess for arrhythmia previous PVCs Continue aspirin beta-blocker and her statin   Next appointment: 3 months   Medication Adjustments/Labs and Tests Ordered: Current medicines are reviewed at length with the patient today.  Concerns regarding medicines are outlined above.  Orders Placed This Encounter  Procedures   EKG 12-Lead   No orders of the defined types were placed in this encounter.    History of Present Illness:    Jessica Francis is a 62 y.o. female with a hx of  CAD with a very elevated coronary calcium score 359/97th percentile hyperlipidemia and PVCs last seen 03/21/2021.  Compliance with diet, lifestyle and medications: Yes  She is seen today because she does not feel as well she finds herself fatigued great deal of stress through work and family does not tolerate Zetia with GI side effects and is aware of her heart beating with palpitation. She has a very strong family history of CAD premature CAD and cardiac death is worried about her prognosis and can start she needs a repeat ischemia evaluation We know her coronary anatomy  she has single-vessel CAD I think she is ideally suited for stress echocardiogram if abnormal and left circumflex ischemia I refer to coronary angiography Will also apply an event monitor for 1 week to follow-up on palpitation She is not having edema shortness of breath orthopnea or syncope She tells me she is seen pulmonary in [place] has had multiple CTs was told that her heart was enlarged had an echocardiogram in his office and she does not know the results.  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   Cervicalgia    Chest pain 04/21/2019   Cholecystitis, chronic 03/02/2016   Complication of anesthesia    hard to wake up   Conductive hearing loss in right ear 04/05/2015   Coronary artery calcification seen on CT scan 04/26/2015   Overview:  Normal stress MPS at 7.8 mets Sept 2016 EF 60%   Diarrhea in adult patient 01/25/2015   Erosive gastritis 03/02/2016   Headache(784.0)    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 Pt is aware and Radiology has issued a supplement report indicating no changes.  Overview:  Overview:  Chronic scarring left upper lobe does not represent a malignant process and does NOT NEED  further imaging as has not changed since the year 2000. Pt is aware and Radiology has issued a supplement report indicating no changes.   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 represents the nodule is described on July 2014 CT scan of chest After reviewing prior x-ray studies going back to the year 2000 this area of scarring has not changed in a 14 year interval of time  Plan No additional imaging studies are necessary Pulmonary followup is as needed I will have radiology compare the most recent CT scan of chest with CT scan of neck which shows the upper chest area from 2009   Mild persistent asthma without complication 01/25/2015   Moderate persistent asthma 04/07/2013   Overweight (BMI 25.0-29.9) 12/11/2017   Pain in limb    Postmenopausal atrophic vaginitis    Premature atrial contraction 02/23/2015   Pure hypercholesterolemia    Unspecified sinusitis (chronic)    Ventricular premature beats 02/23/2015   Overview:  With hypokalemia    Current Medications: Current Meds  Medication Sig   aspirin 81 MG tablet Take 81 mg by mouth daily.   cholecalciferol (VITAMIN D) 1000 UNITS tablet Take 1,000 Units by mouth daily.     cycloSPORINE (RESTASIS) 0.05 % ophthalmic emulsion 1 drop in the morning and at bedtime.   estradiol (ESTRACE) 0.1 MG/GM vaginal cream SMARTSIG:0.5 Gram(s) Vaginal 3 Times Daily   ezetimibe (ZETIA) 10 MG tablet Take 10 mg by mouth daily.   fluticasone (FLONASE) 50 MCG/ACT nasal spray Place 2 sprays into the nose daily.     ipratropium (ATROVENT) 0.02 % nebulizer solution Take by nebulization 4 (four) times daily.   Magnesium 500 MG CAPS Take 500 mg by mouth daily.   metoprolol tartrate (LOPRESSOR) 25 MG tablet Take 1.5 tablets (37.5 mg total) by mouth 2 (two) times daily.   montelukast (SINGULAIR) 5 MG chewable tablet Chew 5 mg by mouth daily.   QVAR REDIHALER 80 MCG/ACT inhaler Inhale 2 puffs into the lungs in the morning and at bedtime.   rosuvastatin (CRESTOR) 10 MG tablet Take 10 mg by mouth daily.   [DISCONTINUED] rosuvastatin (CRESTOR) 5 MG tablet Take 5 mg by mouth daily.       EKGs/Labs/Other Studies Reviewed:    The following studies were reviewed today:  Cardiac Studies & Procedures     STRESS TESTS  MYOCARDIAL PERFUSION IMAGING 03/17/2019  Narrative  Nuclear stress EF: 66%. No wall motion abnormalities  There was no ST segment deviation noted during stress.  This is a low risk study. Bowel loop is noted along the lateral wall. In upright images, there does not appear to be any myocardial defect present. No infarct, no ischemia.  The study is normal.  Donato Schultz, MD     MONITORS  CARDIAC EVENT MONITOR 02/04/2015  Narrative NSR. PVCs Brief runs of atrial tachycardia.   CT SCANS  CT CORONARY MORPH W/CTA COR W/SCORE 05/28/2019  Addendum 05/28/2019  7:21 PM ADDENDUM REPORT: 05/28/2019 19:19  CLINICAL DATA:  83F with normal stress test and persistent  chest pain.  EXAM: Cardiac/Coronary  CT  TECHNIQUE: The patient was scanned on a [employer].  FINDINGS: A 120 kV prospective scan was triggered in the descending thoracic aorta at 111 HU's. Axial non-contrast 3 mm slices were carried out through the heart. The data set was analyzed on a dedicated work station and scored using the Agatson method. Gantry rotation speed was 250 msecs and collimation was .6 mm. No beta blockade and 0.8 mg of sl NTG was given. The 3D data set was reconstructed in 5% intervals of the 67-82 % of the R-R cycle. Diastolic phases were analyzed on a dedicated work station using MPR, MIP and VRT modes. The patient received 80 cc of contrast.  Aorta: Normal size. Ascending aorta 3.0 cm. No calcifications. No dissection.  Aortic Valve:  Trileaflet.  No calcifications.  Coronary Arteries:  Normal coronary origin.  Right dominance.  RCA is a large dominant artery that gives rise to PDA and two PLV branches. There is a long segment of non-obstructing (<25%) calcified plaque just proximal to the PDA.  Left main is a large artery that gives  rise to LAD and LCX arteries.  LAD has mild (25-49%) calcified plaque at the ostium. There is mild (25-49%) soft plaque in the mid LAD. There is a small D1 that is heavily calcified at the ostium.  LCX is a non-dominant artery that gives rise to one large OM1 branch. There is a large calcified plaque in the proximal LCX that is moderately obstructive (50-69%).  Other findings:  Normal pulmonary vein drainage into the left atrium.  Normal let atrial appendage without a thrombus.  Normal size of the pulmonary artery.  IMPRESSION: 1. Coronary calcium score of 359. This was 97th percentile for age and sex matched control.  2. Normal coronary origin with right dominance.  3.  There is CAD-RADS 3 (moderate) obstruction in teh LCX.  4.  Will send for FFRct.  5. Recommend aggressive risk factor modification including high-potency statin.  Chilton Si, MD   Electronically Signed By: Chilton Si On: 05/28/2019 19:19  Narrative EXAM: OVER-READ INTERPRETATION  CT CHEST  The following report is an over-read performed by radiologist Dr. Charlett Nose of [facility] on 05/28/2019. This over-read does not include interpretation of cardiac or coronary anatomy or pathology. The coronary CTA interpretation by the cardiologist is attached.  COMPARISON:  None.  FINDINGS: Vascular: Heart is normal size.  Visualized aorta normal caliber.  Mediastinum/Nodes: No adenopathy in the lower mediastinum or hila.  Lungs/Pleura: No confluent opacities or effusions.  Upper Abdomen: Imaging into the upper abdomen shows no acute findings.  Musculoskeletal: Chest wall soft tissues are unremarkable. No acute bony abnormality.  IMPRESSION: No acute or significant extracardiac abnormality.  Electronically Signed: By: Charlett Nose M.D. On: 05/28/2019 09:07          EKG Interpretation Date/Time:  Wednesday December 27 2022 16:15:11 EDT Ventricular Rate:  79 PR  Interval:  126 QRS Duration:  78 QT Interval:  350 QTC Calculation: 401 R Axis:   12  Text Interpretation: Normal sinus rhythm ABNORMAL R WAVE PROGRESSION No previous ECGs available Confirmed by Norman Herrlich ([id number]) on 12/27/2022 4:20:22 PM    Recent Labs: 09/11/2022 LDL 97 cholesterol 176 triglycerides 68 hide hemoglobin 13.5 creatinine 0.71 potassium 4.3  Physical Exam:    VS:  BP 120/74   Pulse 79   Ht 5\' 3"  (1.6 m)   Wt 164 lb 3.2 oz (74.5 kg)  SpO2 98%   BMI 29.09 kg/m     Wt Readings from Last 3 Encounters:  12/27/22 164 lb 3.2 oz (74.5 kg)  03/21/21 172 lb 12.8 oz (78.4 kg)  02/16/20 173 lb 6.4 oz (78.7 kg)     GEN:  Well nourished, well developed in no acute distress HEENT: Normal NECK: No JVD; No carotid bruits LYMPHATICS: No lymphadenopathy CARDIAC: RRR, no murmurs, rubs, gallops RESPIRATORY:  Clear to auscultation without rales, wheezing or rhonchi  ABDOMEN: Soft, non-tender, non-distended MUSCULOSKELETAL:  No edema; No deformity  SKIN: Warm and dry NEUROLOGIC:  Alert and oriented x 3 PSYCHIATRIC:  Normal affect    Signed, Norman Herrlich, MD  12/27/2022 4:38 PM    [facility]

## 2022-12-27 ENCOUNTER — Ambulatory Visit (INDEPENDENT_AMBULATORY_CARE_PROVIDER_SITE_OTHER): Payer: BC Managed Care – PPO

## 2022-12-27 ENCOUNTER — Ambulatory Visit: Payer: BC Managed Care – PPO | Attending: Cardiology | Admitting: Cardiology

## 2022-12-27 ENCOUNTER — Encounter: Payer: Self-pay | Admitting: Cardiology

## 2022-12-27 VITALS — BP 120/74 | HR 79 | Ht 63.0 in | Wt 164.2 lb

## 2022-12-27 DIAGNOSIS — E782 Mixed hyperlipidemia: Secondary | ICD-10-CM

## 2022-12-27 DIAGNOSIS — I2510 Atherosclerotic heart disease of native coronary artery without angina pectoris: Secondary | ICD-10-CM | POA: Diagnosis not present

## 2022-12-27 DIAGNOSIS — R931 Abnormal findings on diagnostic imaging of heart and coronary circulation: Secondary | ICD-10-CM | POA: Diagnosis not present

## 2022-12-27 DIAGNOSIS — I493 Ventricular premature depolarization: Secondary | ICD-10-CM | POA: Diagnosis not present

## 2022-12-27 NOTE — Patient Instructions (Signed)
Medication Instructions:  Your physician recommends that you continue on your current medications as directed. Please refer to the Current Medication list given to you today.  *If you need a refill on your cardiac medications before your next appointment, please call your pharmacy*   Lab Work: None If you have labs (blood work) drawn today and your tests are completely normal, you will receive your results only by: MyChart Message (if you have MyChart) OR A paper copy in the mail If you have any lab test that is abnormal or we need to change your treatment, we will call you to review the results.   Testing/Procedures: A zio monitor was ordered today. It will remain on for 7 days. You will then return monitor and event diary in provided box. It takes 1-2 weeks for report to be downloaded and returned to [place]. We will call you with the results. If monitor falls off or has orange flashing light, please call [employer] for further instructions.   Your physician has requested that you have a stress echocardiogram. For further information please visit [email or web address] Please follow instruction sheet as given.     Follow-Up: At [facility], you and your health needs are our priority.  As part of our continuing mission to provide you with exceptional heart care, we have created designated Provider Care Teams.  These Care Teams include your primary Cardiologist (physician) and Advanced Practice Providers (APPs -  Physician Assistants and Nurse Practitioners) who all work together to provide you with the care you need, when you need it.  We recommend signing up for the patient portal called "MyChart".  Sign up information is provided on this After Visit Summary.  MyChart is used to connect with patients for Virtual Visits (Telemedicine).  Patients are able to view lab/test results, encounter notes, upcoming appointments, etc.  Non-urgent messages can be sent to your provider as well.   To learn  more about what you can do with MyChart, go to [email or web address].    Your next appointment:   6 month(s)  Provider:   Norman Herrlich, MD    Other Instructions None

## 2022-12-27 NOTE — Addendum Note (Signed)
Addended by: Roxanne Mins I on: 12/27/2022 05:06 PM   Modules accepted: Orders

## 2023-01-08 DIAGNOSIS — J4530 Mild persistent asthma, uncomplicated: Secondary | ICD-10-CM | POA: Diagnosis not present

## 2023-01-08 DIAGNOSIS — J8410 Pulmonary fibrosis, unspecified: Secondary | ICD-10-CM | POA: Diagnosis not present

## 2023-01-08 DIAGNOSIS — J479 Bronchiectasis, uncomplicated: Secondary | ICD-10-CM | POA: Diagnosis not present

## 2023-01-08 DIAGNOSIS — Z8616 Personal history of COVID-19: Secondary | ICD-10-CM | POA: Diagnosis not present

## 2023-01-16 ENCOUNTER — Telehealth (HOSPITAL_COMMUNITY): Payer: Self-pay | Admitting: *Deleted

## 2023-01-16 ENCOUNTER — Encounter (HOSPITAL_COMMUNITY): Payer: Self-pay | Admitting: *Deleted

## 2023-01-16 NOTE — Telephone Encounter (Signed)
Patient given instructions for upcoming stress echo.  Jessica Francis

## 2023-01-23 ENCOUNTER — Telehealth: Payer: Self-pay

## 2023-01-23 ENCOUNTER — Other Ambulatory Visit: Payer: Self-pay

## 2023-01-23 ENCOUNTER — Ambulatory Visit: Payer: BC Managed Care – PPO | Attending: Cardiology

## 2023-01-23 ENCOUNTER — Ambulatory Visit: Payer: BC Managed Care – PPO

## 2023-01-23 DIAGNOSIS — I493 Ventricular premature depolarization: Secondary | ICD-10-CM | POA: Diagnosis not present

## 2023-01-23 DIAGNOSIS — E782 Mixed hyperlipidemia: Secondary | ICD-10-CM

## 2023-01-23 DIAGNOSIS — R931 Abnormal findings on diagnostic imaging of heart and coronary circulation: Secondary | ICD-10-CM | POA: Diagnosis not present

## 2023-01-23 DIAGNOSIS — I2510 Atherosclerotic heart disease of native coronary artery without angina pectoris: Secondary | ICD-10-CM | POA: Diagnosis not present

## 2023-02-14 NOTE — Progress Notes (Unsigned)
Cardiology Office Note:    Date:  02/15/2023   ID:  Jessica Francis, DOB [birth year], MRN [id number]  PCP:  Paulina Fusi, MD  Cardiologist:  Norman Herrlich, MD    Referring MD: Paulina Fusi, MD    ASSESSMENT:    1. Cardiovascular stress test abnormal   2. Coronary artery disease involving native coronary artery of native heart, unspecified whether angina present   3. Agatston coronary artery calcium score between 200 and 399   4. Mixed hyperlipidemia   5. Ventricular premature beats    PLAN:    In order of problems listed above:  Mildly abnormal stress echo unfortunately she is quite symptomatic reduced exercise tolerance likely related to her underlying cardiac injury from close infection we will do his cardiac CTA I am not sure she has what I would call hypersensitivity reaction to prednisone but she tolerates Decadron I will find the equivalent dose and we will prep her as an allergy with her contrast dye allergy. Continue her statin Continue her beta-blocker   Next appointment: 3 months   Medication Adjustments/Labs and Tests Ordered: Current medicines are reviewed at length with the patient today.  Concerns regarding medicines are outlined above.  Orders Placed This Encounter  Procedures   CT CORONARY MORPH W/CTA COR W/SCORE W/CA W/CM &/OR WO/CM   Basic Metabolic Panel (BMET)   Meds ordered this encounter  Medications   metoprolol tartrate (LOPRESSOR) 100 MG tablet    Sig: Take 1 tablet (100 mg total) by mouth once for 1 dose. Please take this medication 2 hours before CT    Dispense:  1 tablet    Refill:  0     History of Present Illness:    Jessica Francis is a 62 y.o. female with a hx of CAD with mild less than 50% stenosis LAD right coronary artery and moderate left circumflex 50 to 69% with normal FFR a very high coronary calcium score 359 97th percentile hyperlipidemia and PVCs last seen 12/27/2022.  Following that visit she had an event monitor  reported 01/10/2093 and showed rare ventricular and supraventricular ectopy and she had brief episodes of SVT the longest lasting 16 seconds.  Her triggered events were all sinus rhythm and associated with occasional APCs and PVCs.  She had a stress echo 01/23/2023 showing ischemia in the distribution of the right coronary artery normal EKG response diminished exercise tolerance of 4.6 METS she did not have exertional chest pain.  Compliance with diet, lifestyle and medications: Yes  Somewhat complicated story initially she is can have a cardiac CTA  and she alerted my staff she was allergic to prednisone We switched to a functional test is mildly abnormal She continues to be symptomatic with long-term chronic shortness of breath since she had severe COVID infection last May she has pulmonary fibrosis and bronchiectasis Not having typical angina no recent ACS No edema orthopnea palpitation or syncope  If truly allergic to prednisone and contrast dye I would favor direct referral to coronary angiography for 1 diet exposure On questioning she developed peripheral edema when she took prednisone when she had COVID she is on long-term Decadron that she tolerated Fine the equivalent dose and will prep her with Decadron.  I do not think she has hypersensitivity allergy. At this time I advised continuation of medical treatment noting her aspirin beta-blocker and statin. 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   Cervicalgia    Chest pain 04/21/2019   Cholecystitis, chronic 03/02/2016   Complication of anesthesia    hard to wake up   Conductive hearing loss in right ear 04/05/2015   Coronary artery calcification seen on CT scan 04/26/2015    Overview:  Normal stress MPS at 7.8 mets Sept 2016 EF 60%   Diarrhea in adult patient 01/25/2015   Erosive gastritis 03/02/2016   Headache(784.0)    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 Pt is aware and Radiology has issued a supplement report indicating no changes.  Overview:  Overview:  Chronic scarring left upper lobe does not represent a malignant process and does NOT NEED  further imaging as has not changed since the year 2000. Pt is aware and Radiology has issued a supplement report indicating no changes.  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 represents the nodule is described on July 2014 CT scan of chest After reviewing prior x-ray studies going back to the year 2000 this area of scarring has not changed in a 14 year interval of time  Plan No additional imaging studies are necessary Pulmonary followup is as needed I will have radiology compare the most recent CT scan of chest with CT scan of neck which shows the upper chest area from 2009   Mild persistent asthma without complication 01/25/2015   Moderate persistent asthma 04/07/2013   Overweight (BMI 25.0-29.9) 12/11/2017   Pain in limb    Postmenopausal atrophic vaginitis    Premature atrial contraction 02/23/2015   Pure hypercholesterolemia    Unspecified sinusitis (chronic)    Ventricular premature beats 02/23/2015   Overview:  With hypokalemia    Current Medications: Current Meds  Medication Sig   aspirin 81 MG tablet Take 81 mg by mouth daily.   cholecalciferol (VITAMIN D) 1000 UNITS tablet Take 1,000 Units by mouth  daily.     cycloSPORINE (RESTASIS) 0.05 % ophthalmic emulsion 1 drop in the morning and at bedtime.   estradiol (ESTRACE) 0.1 MG/GM vaginal cream SMARTSIG:0.5 Gram(s) Vaginal 3 Times Daily   ezetimibe (ZETIA) 10 MG tablet Take 10 mg by mouth daily.   fluticasone (FLONASE) 50 MCG/ACT nasal spray Place 2 sprays into the nose daily.     ipratropium (ATROVENT) 0.02 % nebulizer solution Take by nebulization 4 (four) times daily.   Magnesium 500 MG CAPS Take 500 mg by mouth daily.   metoprolol tartrate (LOPRESSOR) 100 MG tablet Take 1 tablet (100 mg total) by mouth once for 1 dose. Please take this medication 2 hours before CT   metoprolol tartrate (LOPRESSOR) 25 MG tablet Take 1.5 tablets (37.5 mg total) by mouth 2 (two) times daily.   montelukast (SINGULAIR) 5 MG chewable tablet Chew 5 mg by mouth daily.   nitroGLYCERIN (NITROSTAT) 0.4 MG SL tablet Place 1 tablet (0.4 mg total) under the tongue every  5 (five) minutes as needed for chest pain.   QVAR REDIHALER 80 MCG/ACT inhaler Inhale 2 puffs into the lungs in the morning and at bedtime.   rosuvastatin (CRESTOR) 10 MG tablet Take 10 mg by mouth daily.      EKGs/Labs/Other Studies Reviewed:    The following studies were reviewed today:  Cardiac Studies & Procedures     STRESS TESTS  ECHOCARDIOGRAM STRESS TEST 01/23/2023  Narrative EXERCISE STRESS ECHO REPORT   --------------------------------------------------------------------------------  Patient Name:   DEMICA SUGAI Date of Exam: 01/23/2023 Medical Rec #:  [id number]     Height:       63.0 in Accession #:    [id number]    Weight:       164.2 lb Date of Birth:  1961-03-19    BSA:          1.778 m Patient Age:    61 years      BP:           120/74 mmHg Patient Gender: F             HR:           93 bpm. Exam Location:  [place]  Procedure: Stress Echo  Indications:    Coronary artery disease involving native coronary artery of native heart, unspecified whether angina present  [I25.10] Agatston coronary artery calcium score between 200 and 399 [R93.1]  History:        Patient has no prior history of Echocardiogram examinations.  Sonographer:    Louie Boston RDCS Referring Phys: [id number] [place] Francisco J Tashawna Thom  IMPRESSIONS   1. Abnormal stress echocardiogram portion of the test. 2. Normal EKG portion of the stress test. 3. This is a positive stress echocardiogram for ischemia (see scoring diagram/findings for description), in the distribution of the right coronary artery. 4. This is an intermediate risk study.  Conclusion(s)/Recommendation(s): Consider further evaluation for coronary artery disease with alternate diagnostic modalities such as coronary angiogram.  FINDINGS  Exam Protocol: The patient exercised on a treadmill according to a Bruce protocol.   Patient Performance: The patient exercised for 3 minutes and 10 seconds, achieving 4.6 METS. The maximum stage achieved was I of the Bruce protocol. The heart rate at peak stress was 179 bpm. The target heart rate was calculated to be 134 bpm. The percentage of maximum predicted heart rate achieved was 113.9 %. The baseline blood pressure was 125/64 mmHg. The blood pressure at peak stress was 167/65 mmHg. The patient developed shortness of breath during the stress exam. The patient's functional capacity was below average.  EKG: Resting EKG showed normal sinus rhythm. The patient developed frequent PAC and no abnormal EKG findings during exercise.   2D Echo Findings: The baseline ejection fraction was 55-60%. The peak ejection fraction at stress was 60-65%. Baseline regional wall motion abnormalities were not present. This is a positive stress echocardiogram for ischemia, in the distribution of the right coronary artery. This is a positive stress echocardiogram for ischemia.   LV Wall Scoring: Baseline  There is diffuse normal.  Peak Stress The inferior wall, mid inferoseptal segment, and basal inferoseptal  segment are hypokinetic. The entire anterior wall, entire lateral wall, entire anterior septum, and entire apex are normal.   Additional Findings: Normal EKG portion of the stress test. Abnormal stress echocardiogram portion of the test.   Vern Claude reddy Madireddy Electronically signed on 01/23/2023 at 3:37:47 PM     Final     MONITORS  LONG TERM  MONITOR (3-14 DAYS) 01/11/2023  Narrative Patch Wear Time:  7 days and 2 hours (2024-07-31T17:04:54-0400 to 2024-08-07T19:50:16-0400)  Patient had a min HR of 45 bpm, max HR of 194 bpm, and avg HR of 74 bpm. Predominant underlying rhythm was Sinus Rhythm.  26 triggered events all sinus rhythm predominantly associated with occasional APCs and PVCs.  There were no sinus pauses of 3 seconds or greater no episodes of second or third-degree AV nodal block.  No episodes of atrial fibrillation or flutter  6 Supraventricular Tachycardia runs occurred, the run with the fastest interval lasting 16 beats with a max rate of 194 bpm, the longest lasting 15.9 secs with an avg rate of 114 bpm. Supraventricular Tachycardia was detected within +/- 45 seconds of symptomatic patient event(s).  Isolated SVEs were rare (<1.0%), SVE Couplets were rare (<1.0%), and SVE Triplets were rare (<1.0%).  Isolated VEs were rare (<1.0%), and no VE Couplets or VE Triplets were present.   CT SCANS  CT CORONARY MORPH W/CTA COR W/SCORE 05/28/2019  Addendum 05/28/2019  7:21 PM ADDENDUM REPORT: 05/28/2019 19:19  CLINICAL DATA:  21F with normal stress test and persistent chest pain.  EXAM: Cardiac/Coronary  CT  TECHNIQUE: The patient was scanned on a [employer].  FINDINGS: A 120 kV prospective scan was triggered in the descending thoracic aorta at 111 HU's. Axial non-contrast 3 mm slices were carried out through the heart. The data set was analyzed on a dedicated work station and scored using the Agatson method. Gantry rotation speed was 250  msecs and collimation was .6 mm. No beta blockade and 0.8 mg of sl NTG was given. The 3D data set was reconstructed in 5% intervals of the 67-82 % of the R-R cycle. Diastolic phases were analyzed on a dedicated work station using MPR, MIP and VRT modes. The patient received 80 cc of contrast.  Aorta: Normal size. Ascending aorta 3.0 cm. No calcifications. No dissection.  Aortic Valve:  Trileaflet.  No calcifications.  Coronary Arteries:  Normal coronary origin.  Right dominance.  RCA is a large dominant artery that gives rise to PDA and two PLV branches. There is a long segment of non-obstructing (<25%) calcified plaque just proximal to the PDA.  Left main is a large artery that gives rise to LAD and LCX arteries.  LAD has mild (25-49%) calcified plaque at the ostium. There is mild (25-49%) soft plaque in the mid LAD. There is a small D1 that is heavily calcified at the ostium.  LCX is a non-dominant artery that gives rise to one large OM1 branch. There is a large calcified plaque in the proximal LCX that is moderately obstructive (50-69%).  Other findings:  Normal pulmonary vein drainage into the left atrium.  Normal let atrial appendage without a thrombus.  Normal size of the pulmonary artery.  IMPRESSION: 1. Coronary calcium score of 359. This was 97th percentile for age and sex matched control.  2. Normal coronary origin with right dominance.  3.  There is CAD-RADS 3 (moderate) obstruction in teh LCX.  4.  Will send for FFRct.  5. Recommend aggressive risk factor modification including high-potency statin.  Chilton Si, MD   Electronically Signed By: Chilton Si On: 05/28/2019 19:19  Narrative EXAM: OVER-READ INTERPRETATION  CT CHEST  The following report is an over-read performed by radiologist Dr. Charlett Nose of [facility] on 05/28/2019. This over-read does not include interpretation of cardiac or coronary anatomy or  pathology. The coronary CTA interpretation  by the cardiologist is attached.  COMPARISON:  None.  FINDINGS: Vascular: Heart is normal size.  Visualized aorta normal caliber.  Mediastinum/Nodes: No adenopathy in the lower mediastinum or hila.  Lungs/Pleura: No confluent opacities or effusions.  Upper Abdomen: Imaging into the upper abdomen shows no acute findings.  Musculoskeletal: Chest wall soft tissues are unremarkable. No acute bony abnormality.  IMPRESSION: No acute or significant extracardiac abnormality.  Electronically Signed: By: Charlett Nose M.D. On: 05/28/2019 09:07              Recent Labs: No results found for requested labs within last 365 days.  Recent Lipid Panel No results found for: "CHOL", "TRIG", "HDL", "CHOLHDL", "VLDL", "LDLCALC", "LDLDIRECT"  Physical Exam:    VS:  BP 122/62 (BP Location: Left Arm, Patient Position: Sitting, Cuff Size: Normal)   Pulse 92   Ht 5\' 1"  (1.549 m)   Wt 162 lb 12.8 oz (73.8 kg)   SpO2 99%   BMI 30.76 kg/m     Wt Readings from Last 3 Encounters:  02/15/23 162 lb 12.8 oz (73.8 kg)  12/27/22 164 lb 3.2 oz (74.5 kg)  03/21/21 172 lb 12.8 oz (78.4 kg)     GEN:  Well nourished, well developed in no acute distress HEENT: Normal NECK: No JVD; No carotid bruits LYMPHATICS: No lymphadenopathy CARDIAC: RRR, no murmurs, rubs, gallops RESPIRATORY:  Clear to auscultation without rales, wheezing or rhonchi  ABDOMEN: Soft, non-tender, non-distended MUSCULOSKELETAL:  No edema; No deformity  SKIN: Warm and dry NEUROLOGIC:  Alert and oriented x 3 PSYCHIATRIC:  Normal affect    Signed, Norman Herrlich, MD  02/15/2023 11:57 AM    [facility]

## 2023-02-15 ENCOUNTER — Encounter: Payer: Self-pay | Admitting: Cardiology

## 2023-02-15 ENCOUNTER — Ambulatory Visit: Payer: BC Managed Care – PPO | Attending: Cardiology | Admitting: Cardiology

## 2023-02-15 VITALS — BP 122/62 | HR 92 | Ht 61.0 in | Wt 162.8 lb

## 2023-02-15 DIAGNOSIS — R931 Abnormal findings on diagnostic imaging of heart and coronary circulation: Secondary | ICD-10-CM

## 2023-02-15 DIAGNOSIS — R9439 Abnormal result of other cardiovascular function study: Secondary | ICD-10-CM | POA: Diagnosis not present

## 2023-02-15 DIAGNOSIS — I493 Ventricular premature depolarization: Secondary | ICD-10-CM | POA: Diagnosis not present

## 2023-02-15 DIAGNOSIS — E782 Mixed hyperlipidemia: Secondary | ICD-10-CM

## 2023-02-15 DIAGNOSIS — I2510 Atherosclerotic heart disease of native coronary artery without angina pectoris: Secondary | ICD-10-CM | POA: Diagnosis not present

## 2023-02-15 MED ORDER — METOPROLOL TARTRATE 100 MG PO TABS: 100.0000 mg | ORAL_TABLET | Freq: Once | ORAL | 0 refills | Status: DC

## 2023-02-15 NOTE — Patient Instructions (Signed)
Medication Instructions:  Your physician recommends that you continue on your current medications as directed. Please refer to the Current Medication list given to you today.  *If you need a refill on your cardiac medications before your next appointment, please call your pharmacy*   Lab Work: Your physician recommends that you return for lab work in:   Labs today: BMP  If you have labs (blood work) drawn today and your tests are completely normal, you will receive your results only by: MyChart Message (if you have MyChart) OR A paper copy in the mail If you have any lab test that is abnormal or we need to change your treatment, we will call you to review the results.   Testing/Procedures:   Your cardiac CT will be scheduled at one of the below locations:   [facility] [address] [place], [place] [address] [phone]  OR  [facility] [address] [place], [place] [address] [phone]  OR   [facility] [address] [place], [place] [address] [phone]  If scheduled at [facility], please arrive at the [facility] (Entrance C2) of [facility] 30 minutes prior to test start time. You can use the FREE valet parking offered at entrance C (encouraged to control the heart rate for the test)  Proceed to the [facility] Radiology Department (first floor) to check-in and test prep.  All radiology patients and guests should use entrance C2 at [facility], accessed from [place], even though the hospital's physical address listed is [address]    If scheduled at [facility] or [facility], please arrive 15 mins early for check-in and test prep.  There is spacious parking and easy access to the radiology department from the [facility] entrance. Please  enter here and check-in with the desk attendant.   Please follow these instructions carefully (unless otherwise directed):  An IV will be required for this test and Nitroglycerin will be given.  Hold all erectile dysfunction medications at least 3 days (72 hrs) prior to test. (Ie viagra, cialis, sildenafil, tadalafil, etc)   On the Night Before the Test: Be sure to Drink plenty of water. Do not consume any caffeinated/decaffeinated beverages or chocolate 12 hours prior to your test. Do not take any antihistamines 12 hours prior to your test.  On the Day of the Test: Drink plenty of water until 1 hour prior to the test. Do not eat any food 1 hour prior to test. You may take your regular medications prior to the test.  Take metoprolol (Lopressor) two hours prior to test. FEMALES- please wear underwire-free bra if available, avoid dresses & tight clothing       After the Test: Drink plenty of water. After receiving IV contrast, you may experience a mild flushed feeling. This is normal. On occasion, you may experience a mild rash up to 24 hours after the test. This is not dangerous. If this occurs, you can take Benadryl 25 mg and increase your fluid intake. If you experience trouble breathing, this can be serious. If it is severe call 911 IMMEDIATELY. If it is mild, please call our office.  We will call to schedule your test 2-4 weeks out understanding that some insurance companies will need an authorization prior to the service being performed.   For more information and frequently asked questions, please  visit our website : [email or web address]  For non-scheduling related questions, please contact the cardiac imaging nurse navigator should you have any questions/concerns: Cardiac Imaging Nurse Navigators Direct Office Dial: [phone]   For scheduling needs, including cancellations and rescheduling, please call [place], [phone].    Follow-Up: At [facility], you and your health needs are our priority.  As part of our continuing mission to provide you with exceptional heart care, we have created designated Provider Care Teams.  These Care Teams include your primary Cardiologist (physician) and Advanced Practice Providers (APPs -  Physician Assistants and Nurse Practitioners) who all work together to provide you with the care you need, when you need it.  We recommend signing up for the patient portal called "MyChart".  Sign up information is provided on this After Visit Summary.  MyChart is used to connect with patients for Virtual Visits (Telemedicine).  Patients are able to view lab/test results, encounter notes, upcoming appointments, etc.  Non-urgent messages can be sent to your provider as well.   To learn more about what you can do with MyChart, go to [email or web address].    Your next appointment:   3 month(s)  Provider:   Norman Herrlich, MD    Other Instructions None

## 2023-02-16 LAB — BASIC METABOLIC PANEL
BUN/Creatinine Ratio: 16 (ref 12–28)
BUN: 12 mg/dL (ref 8–27)
CO2: 24 mmol/L (ref 20–29)
Calcium: 9.7 mg/dL (ref 8.7–10.3)
Chloride: 104 mmol/L (ref 96–106)
Creatinine, Ser: 0.74 mg/dL (ref 0.57–1.00)
Glucose: 85 mg/dL (ref 70–99)
Potassium: 4.2 mmol/L (ref 3.5–5.2)
Sodium: 142 mmol/L (ref 134–144)
eGFR: 91 mL/min/{1.73_m2} (ref >59)

## 2023-02-16 MED ORDER — DEXAMETHASONE 4 MG PO TABS: ORAL_TABLET | ORAL | 0 refills | Status: DC

## 2023-02-16 NOTE — Addendum Note (Signed)
Addended by: Roxanne Mins I on: 02/16/2023 05:09 PM   Modules accepted: Orders

## 2023-03-02 ENCOUNTER — Telehealth (HOSPITAL_COMMUNITY): Payer: Self-pay | Admitting: *Deleted

## 2023-03-02 NOTE — Telephone Encounter (Signed)
Reaching out to patient to offer assistance regarding upcoming cardiac imaging study; pt verbalizes understanding of appt date/time, parking situation and where to check in, pre-test NPO status and medications ordered, and verified current allergies; name and call back number provided for further questions should they arise Johney Frame RN Navigator Cardiac Imaging [facility] [phone] office [phone] cell  Patient allergic to contrast media and prednisone.  Ordering MD prescibed 13 hour prep with decadron.  Spoke to [facility]-ok to proceed as prescribed.

## 2023-03-05 ENCOUNTER — Other Ambulatory Visit: Payer: Self-pay | Admitting: Cardiology

## 2023-03-05 ENCOUNTER — Ambulatory Visit (HOSPITAL_BASED_OUTPATIENT_CLINIC_OR_DEPARTMENT_OTHER)
Admission: RE | Admit: 2023-03-05 | Discharge: 2023-03-05 | Disposition: A | Discharge status: Home / Self Care | Payer: BC Managed Care – PPO | Source: Ambulatory Visit | Attending: Cardiology

## 2023-03-05 ENCOUNTER — Ambulatory Visit (HOSPITAL_COMMUNITY)
Admission: RE | Admit: 2023-03-05 | Discharge: 2023-03-05 | Disposition: A | Discharge status: Home / Self Care | Payer: BC Managed Care – PPO | Source: Ambulatory Visit | Attending: Cardiology | Admitting: Cardiology

## 2023-03-05 DIAGNOSIS — R931 Abnormal findings on diagnostic imaging of heart and coronary circulation: Secondary | ICD-10-CM

## 2023-03-05 DIAGNOSIS — E782 Mixed hyperlipidemia: Secondary | ICD-10-CM | POA: Insufficient documentation

## 2023-03-05 DIAGNOSIS — I493 Ventricular premature depolarization: Secondary | ICD-10-CM | POA: Diagnosis not present

## 2023-03-05 DIAGNOSIS — I2510 Atherosclerotic heart disease of native coronary artery without angina pectoris: Secondary | ICD-10-CM

## 2023-03-05 MED ORDER — IOHEXOL 350 MG/ML SOLN
95.0000 mL | Freq: Once | INTRAVENOUS | Status: AC | PRN
  Administered 2023-03-05: 95 mL via INTRAVENOUS

## 2023-03-05 MED ORDER — NITROGLYCERIN 0.4 MG SL SUBL
SUBLINGUAL_TABLET | SUBLINGUAL | Status: AC
  Filled 2023-03-05: qty 1

## 2023-03-05 MED ORDER — NITROGLYCERIN 0.4 MG SL SUBL
0.8000 mg | SUBLINGUAL_TABLET | Freq: Once | SUBLINGUAL | Status: AC
  Administered 2023-03-05: 0.8 mg via SUBLINGUAL

## 2023-03-05 MED ORDER — NITROGLYCERIN 0.4 MG SL SUBL
SUBLINGUAL_TABLET | SUBLINGUAL | Status: AC
  Filled 2023-03-05: qty 2

## 2023-03-08 ENCOUNTER — Encounter: Payer: Self-pay | Admitting: Cardiology

## 2023-03-08 ENCOUNTER — Other Ambulatory Visit: Payer: Self-pay | Admitting: Cardiology

## 2023-03-19 DIAGNOSIS — Z0000 Encounter for general adult medical examination without abnormal findings: Secondary | ICD-10-CM | POA: Diagnosis not present

## 2023-05-07 NOTE — Progress Notes (Unsigned)
Cardiology Office Note:    Date:  05/07/2023   ID:  Jessica Francis, DOB Feb 17, 1961, MRN [id number]  PCP:  Jessica Fusi, MD  Cardiologist:  Jessica Herrlich, MD    Referring MD: Jessica Fusi, MD    ASSESSMENT:    No diagnosis found. PLAN:    In order of problems listed above:  ***   Next appointment: ***   Medication Adjustments/Labs and Tests Ordered: Current medicines are reviewed at length with the patient today.  Concerns regarding medicines are outlined above.  No orders of the defined types were placed in this encounter.  No orders of the defined types were placed in this encounter.    History of Present Illness:    Jessica Francis is a 62 y.o. female with a hx of coronary artery disease severely elevated coronary calcium score 359/97 percentile hyperlipidemia and PVCs last seen 03/17/2023.  Following the visit she had a cardiac CTA reported 03/05/2023 with a calcium score of 681/99th percentile and moderate CAD with 50 to 69% stenosis small caliber diagonal branch mild 25 to 49% stenosis ostium of the LAD and minimal stenosis of the right coronary artery less than 25%. 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   Cervicalgia    Chest pain 04/21/2019   Cholecystitis, chronic 03/02/2016   Complication of anesthesia    hard to wake up   Conductive hearing loss in right ear 04/05/2015   Coronary artery calcification seen on CT scan 04/26/2015   Overview:  Normal stress MPS at 7.8 mets Sept 2016 EF 60%   Diarrhea in adult patient 01/25/2015   Erosive gastritis 03/02/2016   Headache(784.0)    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 Pt is aware and Radiology has issued a supplement report indicating no changes.  Overview:  Overview:  Chronic scarring left upper lobe does not represent a malignant process and does NOT NEED  further imaging as has not changed since the year 2000. Pt is aware and Radiology has issued a supplement report indicating no changes.  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 represents the nodule is described on July 2014 CT scan of chest After reviewing prior x-ray studies going back to the year 2000 this area of scarring has not changed in a 14 year interval of time  Plan No additional imaging studies are necessary Pulmonary followup is as needed I will have radiology compare the most recent CT scan of chest with CT scan of neck which shows the upper chest area from 2009   Mild persistent asthma without complication 01/25/2015   Moderate persistent asthma 04/07/2013   Overweight (BMI 25.0-29.9) 12/11/2017   Pain in limb    Postmenopausal atrophic vaginitis    Premature  atrial contraction 02/23/2015   Pure hypercholesterolemia    Unspecified sinusitis (chronic)    Ventricular premature beats 02/23/2015   Overview:  With hypokalemia    Current Medications: No outpatient medications have been marked as taking for the 05/08/23 encounter (Appointment) with Baldo Daub, MD.      EKGs/Labs/Other Studies Reviewed:    The following studies were reviewed today:  Cardiac Studies & Procedures     STRESS TESTS  ECHOCARDIOGRAM STRESS TEST 01/23/2023  Narrative EXERCISE STRESS ECHO  REPORT   --------------------------------------------------------------------------------  Patient Name:   Jessica Francis Date of Exam: 01/23/2023 Medical Rec #:  [id number]     Height:       63.0 in Accession #:    [id number]    Weight:       164.2 lb Date of Birth:  May 27, 1961    BSA:          1.778 m Patient Age:    61 years      BP:           120/74 mmHg Patient Gender: F             HR:           93 bpm. Exam Location:  [place]  Procedure: Stress Echo  Indications:    Coronary artery disease involving native coronary artery of native heart, unspecified whether angina present [I25.10] Agatston coronary artery calcium score between 200 and 399 [R93.1]  History:        Patient has no prior history of Echocardiogram examinations.  Sonographer:    Louie Boston RDCS Referring Phys: [id number] Rilya Longo J Thomos Domine  IMPRESSIONS   1. Abnormal stress echocardiogram portion of the test. 2. Normal EKG portion of the stress test. 3. This is a positive stress echocardiogram for ischemia (see scoring diagram/findings for description), in the distribution of the right coronary artery. 4. This is an intermediate risk study.  Conclusion(s)/Recommendation(s): Consider further evaluation for coronary artery disease with alternate diagnostic modalities such as coronary angiogram.  FINDINGS  Exam Protocol: The patient exercised on a treadmill according to a Bruce protocol.   Patient Performance: The patient exercised for 3 minutes and 10 seconds, achieving 4.6 METS. The maximum stage achieved was I of the Bruce protocol. The heart rate at peak stress was 179 bpm. The target heart rate was calculated to be 134 bpm. The percentage of maximum predicted heart rate achieved was 113.9 %. The baseline blood pressure was 125/64 mmHg. The blood pressure at peak stress was 167/65 mmHg. The patient developed shortness of breath during the stress exam. The patient's functional capacity was below average.  EKG:  Resting EKG showed normal sinus rhythm. The patient developed frequent PAC and no abnormal EKG findings during exercise.   2D Echo Findings: The baseline ejection fraction was 55-60%. The peak ejection fraction at stress was 60-65%. Baseline regional wall motion abnormalities were not present. This is a positive stress echocardiogram for ischemia, in the distribution of the right coronary artery. This is a positive stress echocardiogram for ischemia.   LV Wall Scoring: Baseline  There is diffuse normal.  Peak Stress The inferior wall, mid inferoseptal segment, and basal inferoseptal segment are hypokinetic. The entire anterior wall, entire lateral wall, entire anterior septum, and entire apex are normal.   Additional Findings: Normal EKG portion of the stress test. Abnormal stress echocardiogram portion of the test.   Vern Claude reddy Madireddy Electronically signed on 01/23/2023 at 3:37:47 PM  Final     MONITORS  LONG TERM MONITOR (3-14 DAYS) 01/11/2023  Narrative Patch Wear Time:  7 days and 2 hours (2024-07-31T17:04:54-0400 to 2024-08-07T19:50:16-0400)  Patient had a min HR of 45 bpm, max HR of 194 bpm, and avg HR of 74 bpm. Predominant underlying rhythm was Sinus Rhythm.  26 triggered events all sinus rhythm predominantly associated with occasional APCs and PVCs.  There were no sinus pauses of 3 seconds or greater no episodes of second or third-degree AV nodal block.  No episodes of atrial fibrillation or flutter  6 Supraventricular Tachycardia runs occurred, the run with the fastest interval lasting 16 beats with a max rate of 194 bpm, the longest lasting 15.9 secs with an avg rate of 114 bpm. Supraventricular Tachycardia was detected within +/- 45 seconds of symptomatic patient event(s).  Isolated SVEs were rare (<1.0%), SVE Couplets were rare (<1.0%), and SVE Triplets were rare (<1.0%).  Isolated VEs were rare (<1.0%), and no VE Couplets or VE Triplets were  present.   CT SCANS  CT CORONARY MORPH W/CTA COR W/SCORE 03/05/2023  Addendum 03/27/2023  4:11 PM ADDENDUM REPORT: 03/27/2023 16:08  EXAM: OVER-READ INTERPRETATION  CT CHEST  The following report is an over-read performed by radiologist Dr. Arvella Nigh [facility] on 03/27/2023. This over-read does not include interpretation of cardiac or coronary anatomy or pathology. The coronary CTA interpretation by the cardiologist is attached.  COMPARISON:  CT chest 09/08/2022  FINDINGS: Visualized mediastinal structures are unremarkable.  Visualized lungs demonstrate mild peripheral increased interstitial markings without significant change. No acute airspace process or effusion. Airways are unremarkable.  Mild T9 compression fracture unchanged. Visualized images through the upper abdomen are unremarkable. Remainder the exam is unchanged.  IMPRESSION: 1. No acute findings. 2. Mild peripheral increased interstitial markings without significant change, likely chronic interstitial lung disease. 3. Mild T9 compression fracture unchanged.   Electronically Signed By: Elberta Fortis M.D. On: 03/27/2023 16:08  Narrative CLINICAL DATA:  This 62 year old female with anginal symptoms.  EXAM: Cardiac/Coronary  CTA  TECHNIQUE: The patient was scanned on a [employer].  FINDINGS: A 100 kV prospective scan was triggered in the descending thoracic aorta at 111 HU's. Axial non-contrast 3 mm slices were carried out through the heart. The data set was analyzed on a dedicated work station and scored using the Agatson method. Gantry rotation speed was 250 msecs and collimation was .6 mm. No beta blockade and 0.8 mg of sl NTG was given. The 3D data set was reconstructed in 5% intervals of the 67-82 % of the R-R cycle. Diastolic phases were analyzed on a dedicated work station using MPR, MIP and VRT modes. The patient received 80 cc of contrast.  Aorta: Normal  size.  No calcifications.  No dissection.  Aortic Valve:  Trileaflet.  No calcifications.  Coronary Arteries:  Normal coronary origin.  Right dominance.  RCA is a large dominant artery that gives rise to PDA and PLA. There 2 separate focal minimal (<24%) calcified plaques in the proximal RCA. Mid to dist RCA with long mild calcified plaque the terminates at the bifurcation of the PDA and PLA. Focal minimal calcification in the mid PDA.  Left main is a large artery that gives rise to LAD and LCX arteries.  LAD is a large vessel. There is mild (25-49%) focal calcified plaque in the ostium of the LAD. The proximal LAD with mixed mild soft plaque. The mid and distal LAD with no plaques. D1  is a small caliber vessel with moderate (50-69%) calcified plaque in the proximal portion of the vessel.  LCX is a non-dominant artery that gives rise to one large OM1 branch. There is mild focal calcified plaque in the proximal LCX. Mid and distal LCX with no plaques. The OM1 vessel with mild calcified plaque in the proximal portion of the vessel.  Coronary Calcium Score:  Left main: 0  Left anterior descending artery: 85.2  Left circumflex artery: 60.4  Right coronary artery: 536  Total: 681  Percentile: 99  Other findings:  Normal pulmonary vein drainage into the left atrium.  Normal left atrial appendage without a thrombus.  Normal size of the pulmonary artery.  IMPRESSION: 1. Coronary calcium score of 681. This was 1 percentile for age and sex matched control.  2. Normal coronary origin with right dominance.  3. CAD-RADS 3. Moderate stenosis. Consider symptom-guided anti-ischemic pharmacotherapy as well as risk factor modification per guideline directed care. Additional analysis with CT FFR will be submitted.  The noncardiac portion of this study will be interpreted in separate report by the radiologist.  Electronically Signed: By: Thomasene Ripple D.O. On: 03/05/2023  15:18   CT SCANS  CT CORONARY MORPH W/CTA COR W/SCORE 05/28/2019  Addendum 05/28/2019  7:21 PM ADDENDUM REPORT: 05/28/2019 19:19  CLINICAL DATA:  89F with normal stress test and persistent chest pain.  EXAM: Cardiac/Coronary  CT  TECHNIQUE: The patient was scanned on a [employer].  FINDINGS: A 120 kV prospective scan was triggered in the descending thoracic aorta at 111 HU's. Axial non-contrast 3 mm slices were carried out through the heart. The data set was analyzed on a dedicated work station and scored using the Agatson method. Gantry rotation speed was 250 msecs and collimation was .6 mm. No beta blockade and 0.8 mg of sl NTG was given. The 3D data set was reconstructed in 5% intervals of the 67-82 % of the R-R cycle. Diastolic phases were analyzed on a dedicated work station using MPR, MIP and VRT modes. The patient received 80 cc of contrast.  Aorta: Normal size. Ascending aorta 3.0 cm. No calcifications. No dissection.  Aortic Valve:  Trileaflet.  No calcifications.  Coronary Arteries:  Normal coronary origin.  Right dominance.  RCA is a large dominant artery that gives rise to PDA and two PLV branches. There is a long segment of non-obstructing (<25%) calcified plaque just proximal to the PDA.  Left main is a large artery that gives rise to LAD and LCX arteries.  LAD has mild (25-49%) calcified plaque at the ostium. There is mild (25-49%) soft plaque in the mid LAD. There is a small D1 that is heavily calcified at the ostium.  LCX is a non-dominant artery that gives rise to one large OM1 branch. There is a large calcified plaque in the proximal LCX that is moderately obstructive (50-69%).  Other findings:  Normal pulmonary vein drainage into the left atrium.  Normal let atrial appendage without a thrombus.  Normal size of the pulmonary artery.  IMPRESSION: 1. Coronary calcium score of 359. This was 97th percentile for age and sex  matched control.  2. Normal coronary origin with right dominance.  3.  There is CAD-RADS 3 (moderate) obstruction in teh LCX.  4.  Will send for FFRct.  5. Recommend aggressive risk factor modification including high-potency statin.  Chilton Si, MD   Electronically Signed By: Chilton Si On: 05/28/2019 19:19  Narrative EXAM: OVER-READ INTERPRETATION  CT CHEST  The following report is an over-read performed by radiologist Dr. Charlett Nose of [facility] on 05/28/2019. This over-read does not include interpretation of cardiac or coronary anatomy or pathology. The coronary CTA interpretation by the cardiologist is attached.  COMPARISON:  None.  FINDINGS: Vascular: Heart is normal size.  Visualized aorta normal caliber.  Mediastinum/Nodes: No adenopathy in the lower mediastinum or hila.  Lungs/Pleura: No confluent opacities or effusions.  Upper Abdomen: Imaging into the upper abdomen shows no acute findings.  Musculoskeletal: Chest wall soft tissues are unremarkable. No acute bony abnormality.  IMPRESSION: No acute or significant extracardiac abnormality.  Electronically Signed: By: Charlett Nose M.D. On: 05/28/2019 09:07              Recent Labs: 02/15/2023: BUN 12; Creatinine, Ser 0.74; Potassium 4.2; Sodium 142  Recent Lipid Panel No results found for: "CHOL", "TRIG", "HDL", "CHOLHDL", "VLDL", "LDLCALC", "LDLDIRECT"  Physical Exam:    VS:  There were no vitals taken for this visit.    Wt Readings from Last 3 Encounters:  02/15/23 162 lb 12.8 oz (73.8 kg)  12/27/22 164 lb 3.2 oz (74.5 kg)  03/21/21 172 lb 12.8 oz (78.4 kg)     GEN: *** Well nourished, well developed in no acute distress HEENT: Normal NECK: No JVD; No carotid bruits LYMPHATICS: No lymphadenopathy CARDIAC: ***RRR, no murmurs, rubs, gallops RESPIRATORY:  Clear to auscultation without rales, wheezing or rhonchi  ABDOMEN: Soft, non-tender,  non-distended MUSCULOSKELETAL:  No edema; No deformity  SKIN: Warm and dry NEUROLOGIC:  Alert and oriented x 3 PSYCHIATRIC:  Normal affect    Signed, Jessica Herrlich, MD  05/07/2023 8:19 PM    [facility]

## 2023-05-08 ENCOUNTER — Encounter: Payer: Self-pay | Admitting: Cardiology

## 2023-05-08 ENCOUNTER — Ambulatory Visit: Payer: BC Managed Care – PPO | Attending: Cardiology | Admitting: Cardiology

## 2023-05-08 VITALS — BP 120/62 | HR 88 | Ht 62.0 in | Wt 162.0 lb

## 2023-05-08 DIAGNOSIS — E782 Mixed hyperlipidemia: Secondary | ICD-10-CM

## 2023-05-08 DIAGNOSIS — I2510 Atherosclerotic heart disease of native coronary artery without angina pectoris: Secondary | ICD-10-CM | POA: Diagnosis not present

## 2023-05-08 DIAGNOSIS — I493 Ventricular premature depolarization: Secondary | ICD-10-CM | POA: Diagnosis not present

## 2023-05-08 MED ORDER — EZETIMIBE 10 MG PO TABS: 5.0000 mg | ORAL_TABLET | Freq: Every day | ORAL | 3 refills | Status: DC

## 2023-05-08 NOTE — Patient Instructions (Signed)
Medication Instructions:  Your physician has recommended you make the following change in your medication:   START: Zetia 5 mg daily  *If you need a refill on your cardiac medications before your next appointment, please call your pharmacy*   Lab Work: Your physician recommends that you return for lab work in:   Labs in 2 month's: Lpa, Lipids, Apo B  If you have labs (blood work) drawn today and your tests are completely normal, you will receive your results only by: MyChart Message (if you have MyChart) OR A paper copy in the mail If you have any lab test that is abnormal or we need to change your treatment, we will call you to review the results.   Testing/Procedures: None   Follow-Up: At [facility], you and your health needs are our priority.  As part of our continuing mission to provide you with exceptional heart care, we have created designated Provider Care Teams.  These Care Teams include your primary Cardiologist (physician) and Advanced Practice Providers (APPs -  Physician Assistants and Nurse Practitioners) who all work together to provide you with the care you need, when you need it.  We recommend signing up for the patient portal called "MyChart".  Sign up information is provided on this After Visit Summary.  MyChart is used to connect with patients for Virtual Visits (Telemedicine).  Patients are able to view lab/test results, encounter notes, upcoming appointments, etc.  Non-urgent messages can be sent to your provider as well.   To learn more about what you can do with MyChart, go to [email or web address].    Your next appointment:   1 year(s)  Provider:   Norman Herrlich, MD    Other Instructions None

## 2023-06-22 ENCOUNTER — Encounter (HOSPITAL_COMMUNITY): Payer: Self-pay

## 2023-06-22 ENCOUNTER — Emergency Department (HOSPITAL_COMMUNITY)
Admission: EM | Admit: 2023-06-22 | Discharge: 2023-06-23 | Disposition: A | Discharge status: Home / Self Care | Payer: BC Managed Care – PPO | Attending: Emergency Medicine | Admitting: Emergency Medicine

## 2023-06-22 ENCOUNTER — Other Ambulatory Visit: Payer: Self-pay

## 2023-06-22 ENCOUNTER — Emergency Department (HOSPITAL_COMMUNITY): Payer: BC Managed Care – PPO

## 2023-06-22 DIAGNOSIS — Z79899 Other long term (current) drug therapy: Secondary | ICD-10-CM | POA: Diagnosis not present

## 2023-06-22 DIAGNOSIS — Z7982 Long term (current) use of aspirin: Secondary | ICD-10-CM | POA: Insufficient documentation

## 2023-06-22 DIAGNOSIS — R002 Palpitations: Secondary | ICD-10-CM | POA: Insufficient documentation

## 2023-06-22 DIAGNOSIS — J4540 Moderate persistent asthma, uncomplicated: Secondary | ICD-10-CM | POA: Diagnosis not present

## 2023-06-22 DIAGNOSIS — I2510 Atherosclerotic heart disease of native coronary artery without angina pectoris: Secondary | ICD-10-CM | POA: Diagnosis not present

## 2023-06-22 DIAGNOSIS — E785 Hyperlipidemia, unspecified: Secondary | ICD-10-CM | POA: Insufficient documentation

## 2023-06-22 LAB — CBC WITH DIFFERENTIAL/PLATELET
Abs Immature Granulocytes: 0.02 10*3/uL (ref 0.00–0.07)
Basophils Absolute: 0 10*3/uL (ref 0.0–0.1)
Basophils Relative: 0 %
Eosinophils Absolute: 0.2 10*3/uL (ref 0.0–0.5)
Eosinophils Relative: 3 %
HCT: 41.7 % (ref 36.0–46.0)
Hemoglobin: 13.3 g/dL (ref 12.0–15.0)
Immature Granulocytes: 0 %
Lymphocytes Relative: 28 %
Lymphs Abs: 1.9 10*3/uL (ref 0.7–4.0)
MCH: 30.5 pg (ref 26.0–34.0)
MCHC: 31.9 g/dL (ref 30.0–36.0)
MCV: 95.6 fL (ref 80.0–100.0)
Monocytes Absolute: 0.4 10*3/uL (ref 0.1–1.0)
Monocytes Relative: 6 %
Neutro Abs: 4.2 10*3/uL (ref 1.7–7.7)
Neutrophils Relative %: 63 %
Platelets: 270 10*3/uL (ref 150–400)
RBC: 4.36 MIL/uL (ref 3.87–5.11)
RDW: 13.6 % (ref 11.5–15.5)
WBC: 6.8 10*3/uL (ref 4.0–10.5)
nRBC: 0 % (ref 0.0–0.2)

## 2023-06-22 LAB — COMPREHENSIVE METABOLIC PANEL
ALT: 18 U/L (ref 0–44)
AST: 22 U/L (ref 15–41)
Albumin: 3.9 g/dL (ref 3.5–5.0)
Alkaline Phosphatase: 81 U/L (ref 38–126)
Anion gap: 12 (ref 5–15)
BUN: 11 mg/dL (ref 8–23)
CO2: 23 mmol/L (ref 22–32)
Calcium: 9.4 mg/dL (ref 8.9–10.3)
Chloride: 104 mmol/L (ref 98–111)
Creatinine, Ser: 0.81 mg/dL (ref 0.44–1.00)
GFR, Estimated: >60 mL/min (ref >60)
Glucose, Bld: 101 mg/dL — ABNORMAL HIGH (ref 70–99)
Potassium: 4 mmol/L (ref 3.5–5.1)
Sodium: 139 mmol/L (ref 135–145)
Total Bilirubin: 0.6 mg/dL (ref 0.0–1.2)
Total Protein: 7.3 g/dL (ref 6.5–8.1)

## 2023-06-22 LAB — TROPONIN I (HIGH SENSITIVITY)
Troponin I (High Sensitivity): 3 ng/L (ref <18)
Troponin I (High Sensitivity): 2 ng/L (ref <18)

## 2023-06-22 NOTE — ED Provider Triage Note (Signed)
Emergency Medicine Provider Triage Evaluation Note  Jessica Francis , a 63 y.o. female  was evaluated in triage.  Pt complains of palpitations that have been ongoing for months.  She has seen her cardiologist for this.  No acute worsening.  Denies any chest pain or shortness of breath.  Review of Systems  Positive: As above Negative: As above  Physical Exam  There were no vitals taken for this visit. Gen:   Awake, no distress   Resp:  Normal effort  MSK:   Moves extremities without difficulty    Medical Decision Making  Medically screening exam initiated at 7:39 PM.  Appropriate orders placed.  Roanna Raider was informed that the remainder of the evaluation will be completed by another provider, this initial triage assessment does not replace that evaluation, and the importance of remaining in the ED until their evaluation is complete.     Arabella Merles, PA-C 06/22/23 1949

## 2023-06-22 NOTE — ED Triage Notes (Signed)
Pt arrived from home via POV c/o palpitations. Pt states that the palpitations have become increasingly worse over the last few days. Pt denies LOC and sob. Pt states that she does sometimes get very hot and feels like she may faint, but has not gone down.

## 2023-06-23 NOTE — ED Provider Notes (Signed)
[place] EMERGENCY DEPARTMENT AT [facility] Provider Note  CSN: [id number] Arrival date & time: 06/22/23 1923  Chief Complaint(s) Palpitations  HPI Jessica Francis is a 63 y.o. 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  Patient reports that she will normally have runs of SVT followed by PVCs every 1 to 2 months however in the past few days, she has had more frequent palpitations stating that she is noted every few hours.  She endorses some mild discomfort and lightheadedness with the palpitations.  States that the SVT/rapid heart rates only last a few seconds followed by the PVCs which resolved within a minute.  She denies any recent fevers or infections.  No coughing or congestion.  No nausea or vomiting.  No associated chest pain or shortness of breath.  No abdominal pain.  She does report being more stressed at work.  The history is provided by the patient.    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   Cervicalgia    Chest pain 04/21/2019   Cholecystitis, chronic 03/02/2016   Complication of anesthesia    hard to wake up   Conductive hearing loss in right ear 04/05/2015   Coronary artery calcification seen on CT scan 04/26/2015   Overview:  Normal stress MPS at 7.8 mets Sept 2016 EF 60%   Diarrhea in adult patient 01/25/2015   Erosive gastritis 03/02/2016   Headache(784.0)    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 Pt is aware and Radiology has issued a supplement report indicating no changes.  Overview:  Overview:  Chronic scarring left upper lobe does not represent a malignant process and does NOT NEED  further imaging as has not changed since the year 2000. Pt is aware and Radiology has issued a supplement report indicating no changes.  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 represents the nodule is described on July 2014 CT scan of chest After reviewing prior x-ray studies going back to the year 2000 this area of scarring has not changed in a 14 year interval of time  Plan No additional imaging studies are necessary Pulmonary followup is as needed I will have radiology compare the most recent CT scan of chest with CT scan of neck which shows the upper chest area from 2009   Mild persistent asthma without complication 01/25/2015   Moderate persistent asthma 04/07/2013   Overweight (BMI 25.0-29.9) 12/11/2017   Pain in limb    Postmenopausal atrophic vaginitis    Premature atrial contraction 02/23/2015   Pure hypercholesterolemia    Unspecified sinusitis (chronic)    Ventricular premature beats 02/23/2015  Overview:  With hypokalemia   Patient Active Problem List   Diagnosis Date Noted   Pure hypercholesterolemia    Postmenopausal atrophic vaginitis    Pain in limb    Complication of anesthesia    Cervicalgia    Bunion    Alopecia    Chest pain 04/21/2019   Asthma 02/18/2019   Palpitations 02/18/2019   Overweight (BMI 25.0-29.9) 12/11/2017   Acute cholecystitis with chronic cholecystitis 03/06/2016   Biliary dyskinesia 03/02/2016   Cholecystitis, chronic 03/02/2016   Erosive gastritis  03/02/2016   Coronary artery calcification seen on CT scan 04/26/2015   Lumbosacral spondylosis 04/26/2015   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 Authorizing Provider  aspirin 81 MG tablet Take 81 mg by mouth daily.    [provider]  cholecalciferol (VITAMIN D) 1000 UNITS tablet Take 1,000 Units by mouth daily.      [provider]  cycloSPORINE (RESTASIS) 0.05 % ophthalmic emulsion Place 1 drop into both eyes in the morning and at bedtime. 12/14/20   [provider]  estradiol (ESTRACE) 0.1 MG/GM vaginal cream Place vaginally at bedtime. 08/27/19   [provider]  ezetimibe (ZETIA) 10 MG tablet Take 0.5 tablets (5 mg total) by mouth daily. 05/08/23   Baldo Daub, MD  fluticasone (FLONASE) 50 MCG/ACT nasal spray Place 2 sprays into the nose daily.      [provider]  ipratropium (ATROVENT) 0.02 % nebulizer solution Take 0.5 mg by nebulization 4 (four) times daily. 01/09/20   [provider]  Magnesium 500 MG CAPS Take 500 mg by mouth daily.    [provider]  metoprolol tartrate (LOPRESSOR) 25 MG tablet Take 1.5 tablets (37.5 mg total) by mouth 2 (two) times daily. 12/10/19   Baldo Daub, MD  montelukast (SINGULAIR) 5 MG chewable tablet Chew 5 mg by mouth daily. 02/10/21   [provider]  nitroGLYCERIN (NITROSTAT) 0.4 MG SL tablet  Place 1 tablet (0.4 mg total) under the tongue every 5 (five) minutes as needed for chest pain. 04/21/19 05/08/23  Baldo Daub, MD  QVAR REDIHALER 80 MCG/ACT inhaler Inhale 2 puffs into the lungs in the morning and at bedtime. 03/15/21   [provider]  rosuvastatin (CRESTOR) 10 MG tablet Take 10 mg by mouth daily.    [provider]  Allergies Iodine, Metrizamide, Prednisone, Contrast media [iodinated contrast media], Doxycycline, and Iohexol  Review of Systems Review of Systems As noted in HPI  Physical Exam Vital Signs  I have reviewed the triage vital signs BP (!) 149/75 (BP Location: Right Arm)   Pulse 84   Temp 98 F (36.7 C) (Oral)   Resp 14   Ht 5\' 2"  (1.575 m)   Wt 74.8 kg   SpO2 100%   BMI 30.18 kg/m   Physical Exam Vitals reviewed.  Constitutional:      General: She is not in acute distress.    Appearance: She is well-developed. She is not diaphoretic.  HENT:     Head: Normocephalic and atraumatic.     Nose: Nose normal.  Eyes:     General: No scleral icterus.       Right eye: No discharge.        Left eye: No discharge.     Conjunctiva/sclera: Conjunctivae normal.     Pupils: Pupils are equal, round, and reactive to light.  Cardiovascular:     Rate and Rhythm: Regular rhythm.     Heart sounds: No murmur heard.    No friction rub. No gallop.  Pulmonary:     Effort: Pulmonary effort is normal. No respiratory distress.     Breath sounds: Normal breath sounds. No stridor. No rales.  Abdominal:     General: There is no distension.     Palpations: Abdomen is soft.     Tenderness: There is no abdominal tenderness.  Musculoskeletal:        General: No tenderness.     Cervical back: Normal range of motion and neck supple.     Right lower leg: No edema.     Left lower leg: No edema.  Skin:    General: Skin is  warm and dry.     Findings: No erythema or rash.  Neurological:     Mental Status: She is alert and oriented to person, place, and time.     ED Results and Treatments Labs (all labs ordered are listed, but only abnormal results are displayed) Labs Reviewed  COMPREHENSIVE METABOLIC PANEL - Abnormal; Notable for the following components:      Result Value   Glucose, Bld 101 (*)    All other components within normal limits  CBC WITH DIFFERENTIAL/PLATELET  TROPONIN I (HIGH SENSITIVITY)  TROPONIN I (HIGH SENSITIVITY)                                                                                                                         EKG  EKG Interpretation Date/Time:  Friday June 22 2023 22:35:35 EST Ventricular Rate:  69 PR Interval:  128 QRS Duration:  78 QT Interval:  382 QTC Calculation: 409 R Axis:   27  Text Interpretation: Normal sinus rhythm Cannot rule out Anterior infarct , age undetermined Abnormal ECG When compared with ECG of 22-Jun-2023 19:43, PREVIOUS ECG IS PRESENT Confirmed by Kalla Watson,  Abdulloh Ullom [phone]) on 06/23/2023 12:49:30 AM       Radiology DG Chest 2 View Result Date: 06/22/2023 CLINICAL DATA:  Chest pain palpitations EXAM: CHEST - 2 VIEW COMPARISON:  Chest CT 09/08/2022 FINDINGS: Chronic lung disease with vague bilateral upper lobe opacities corresponding to areas of bronchiectasis and scarring on prior CT. No acute confluent airspace disease or effusion. Normal cardiomediastinal silhouette. IMPRESSION: Chronic lung disease with vague bilateral upper lobe opacities corresponding to areas of bronchiectasis and scarring on prior CT. No acute airspace disease. Electronically Signed   By: Jasmine Pang M.D.   On: 06/22/2023 20:01    Medications Ordered in ED Medications - No data to display Procedures Procedures  (including critical care time) Medical Decision Making / ED Course   Medical Decision Making Amount and/or Complexity of Data Reviewed Labs:  ordered. Decision-making details documented in ED Course. Radiology: ordered and independent interpretation performed. Decision-making details documented in ED Course. ECG/medicine tests: ordered and independent interpretation performed. Decision-making details documented in ED Course.    Intermittent palpitations EKG without acute ischemic changes, dysrhythmias or blocks. Labs reassuring without electrolyte derangements, renal sufficiency, anemia, leukocytosis.  Troponins were negative.  Chest x-ray with chronic lung disease/bronchiectasis.  No pneumonia. No evidence of volume overload concerning for heart failure. She is hemodynamically stable. Recommend close monitoring of her palpitations and follow-up with cardiology.    Final Clinical Impression(s) / ED Diagnoses Final diagnoses:  Palpitations   The patient appears reasonably screened and/or stabilized for discharge and I doubt any other medical condition or other [facility] requiring further screening, evaluation, or treatment in the ED at this time. I have discussed the findings, Dx and Tx plan with the patient/family who expressed understanding and agree(s) with the plan. Discharge instructions discussed at length. The patient/family was given strict return precautions who verbalized understanding of the instructions. No further questions at time of discharge.  Disposition: Discharge  Condition: Good  ED Discharge Orders     None         Follow Up: Paulina Fusi, MD [address] [place] [address] [phone]  Call  as needed  Baldo Daub, MD [address] [place] [address] [phone]  Call  to schedule an appointment for close follow up    This chart was dictated using voice recognition software.  Despite best efforts to proofread,  errors can occur which can change the documentation meaning.    Nira Conn, MD 06/23/23 [phone]

## 2023-07-25 ENCOUNTER — Other Ambulatory Visit: Payer: Self-pay

## 2023-07-25 ENCOUNTER — Telehealth: Payer: Self-pay | Admitting: Cardiology

## 2023-07-25 DIAGNOSIS — I493 Ventricular premature depolarization: Secondary | ICD-10-CM

## 2023-07-25 NOTE — Telephone Encounter (Signed)
 Called patient and informed her of Dr. Hulen Shouts recommendation below:  "Yes no history of either a smart watch or the mobile Kardia to capture these episodes.  If not we should also apply a 2-week event monitor as EP would certainly want documentation of clinical recurrence for consideration of catheter ablation"  Patient verbalized understanding and had no further questions at this time.

## 2023-07-25 NOTE — Telephone Encounter (Signed)
 Called patient and she reported that she is having more frequent episodes of SVT and PVT. They were occurring every few months but recently they have been occurring almost daily. When she has the episodes of the SVT she states that it feels like her heart is trying to lunge out of her chest and she has to stop and rest. Once the episode calms down she can feel that her heart skips a few beats. She states that Dr. Dulce Sellar had her wear a heart monitor in August and it showed SVT and PVT. She is requesting a consult to EP?

## 2023-07-25 NOTE — Telephone Encounter (Signed)
 Patient calling to see if she can be referred to EP dr. She states she is having more SVT and PVT. Please advise

## 2023-07-27 ENCOUNTER — Ambulatory Visit: Payer: BC Managed Care – PPO | Attending: Cardiology

## 2023-07-27 DIAGNOSIS — I493 Ventricular premature depolarization: Secondary | ICD-10-CM

## 2023-08-06 ENCOUNTER — Ambulatory Visit (INDEPENDENT_AMBULATORY_CARE_PROVIDER_SITE_OTHER)

## 2023-08-06 ENCOUNTER — Encounter: Payer: Self-pay | Admitting: Podiatry

## 2023-08-06 ENCOUNTER — Ambulatory Visit (INDEPENDENT_AMBULATORY_CARE_PROVIDER_SITE_OTHER): Admitting: Podiatry

## 2023-08-06 DIAGNOSIS — M778 Other enthesopathies, not elsewhere classified: Secondary | ICD-10-CM | POA: Diagnosis not present

## 2023-08-06 DIAGNOSIS — M19072 Primary osteoarthritis, left ankle and foot: Secondary | ICD-10-CM

## 2023-08-06 DIAGNOSIS — M7752 Other enthesopathy of left foot: Secondary | ICD-10-CM

## 2023-08-06 MED ORDER — TRIAMCINOLONE ACETONIDE 10 MG/ML IJ SUSP
5.0000 mg | Freq: Once | INTRAMUSCULAR | Status: AC
  Administered 2023-08-06: 5 mg

## 2023-08-06 NOTE — Progress Notes (Unsigned)
 Chief Complaint  Patient presents with   Foot Pain    Left foot pain that starts on the top of the midfoot above arch and travels to both the medial and lateral sides. It started a month ago, she did try to go on [place] and get a boot but it made it worse. She has tried stretching with no results. Not diabetic and takes ASA 81    HPI: 63 y.o. female presents today complaining of pain to the dorsal left midfoot.  This is been going on for about the past month.  Worse with direct dorsal pressure with certain shoes.  Locates this region to be dorsal [place] joint and first TMT region.  Also endorsing soreness to the medial midfoot and lateral midfoot.  Reports trying immobilization in a boot which she bought online, she thinks that this could have made it worse.  Denies any trauma.  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   Cervicalgia    Chest pain 04/21/2019   Cholecystitis, chronic 03/02/2016   Complication of anesthesia    hard to wake up   Conductive hearing loss in right ear 04/05/2015   Coronary artery calcification seen on CT scan 04/26/2015   Overview:  Normal stress MPS at 7.8 mets Sept 2016 EF 60%   Diarrhea in adult patient 01/25/2015   Erosive gastritis 03/02/2016   Headache(784.0)    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 Pt is aware and  Radiology has issued a supplement report indicating no changes.  Overview:  Overview:  Chronic scarring left upper lobe does not represent a malignant process and does NOT NEED  further imaging as has not changed since the year 2000. Pt is aware and Radiology has issued a supplement report indicating no changes.  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 represents the nodule is described on July 2014 CT scan of chest After reviewing prior x-ray studies going back to the year 2000 this area of scarring has not changed in a 14 year interval of time  Plan No additional imaging studies are necessary Pulmonary followup is as needed I will have radiology compare the most recent CT scan of chest with CT scan of neck which shows the upper chest area from 2009   Mild persistent asthma without complication 01/25/2015   Moderate persistent asthma 04/07/2013   Overweight (BMI 25.0-29.9) 12/11/2017   Pain in limb    Postmenopausal atrophic vaginitis    Premature atrial contraction 02/23/2015   Pure hypercholesterolemia    Unspecified sinusitis (chronic)    Ventricular premature beats 02/23/2015   Overview:  With hypokalemia    Past Surgical History:  Procedure Laterality Date   CHOLECYSTECTOMY     HARDWARE REMOVAL  05/04/2011   Procedure: HARDWARE REMOVAL;  Surgeon: Wyn Forster., MD;  Location: [facility];  Service: Orthopedics;  Laterality: Left;  DVR plate removal left wrist,   INNER EAR SURGERY     Perforated eardrum   SHOULDER ARTHROSCOPY  1997   lt   TUBAL LIGATION     WRIST ARTHROPLASTY  9/12   lt -[facility]    Allergies  Allergen Reactions   Iodine Hives    IV, AND BETADINE   Metrizamide Hives   Prednisone Swelling    LOWER EXTREMITY   Contrast Media [Iodinated Contrast Media] Hives   Doxycycline Other (See Comments)    dizzy   Iohexol Hives    ROS    Physical Exam: There were no vitals filed for this visit.  General: The  patient is alert and oriented x3 in no acute distress.  Dermatology: Skin is warm, dry and supple bilateral lower extremities. Interspaces are clear of maceration and debris.    Vascular: Palpable pedal pulses bilaterally. Capillary refill within normal limits.  No diffuse appreciable edema.  No erythema or calor.  Telangiectasias present  Neurological: Light touch sensation grossly intact bilateral feet.   Musculoskeletal Exam: Localized edema left dorsal midfoot with pain on palpation of dorsal midfoot joints.  Some tenderness on palpation of dorsal EHL.  Less severe tenderness on palpation of medial first TMT J, lateral midfoot, and sinus tarsi.  Decreased left lower extremity muscle strength due to guarding.  Radiographic Exam: Left foot 08/06/2023 Normal osseous mineralization. Joint spaces preserved.  No fractures or osseous irregularities noted. ***  Assessment/Plan of Care: 1. Arthritis of left midfoot      Meds ordered this encounter  Medications   triamcinolone acetonide (KENALOG) 10 MG/ML injection 5 mg   None  Discussed clinical findings with patient today.  Radiographs reviewed with patient Findings consistent with arthritis of left midfoot.  Verbal consent obtained to administer corticosteroid injection to dorsal left [place] joints after alcohol skin prep.  Dorsal approach.  0.5 cc of 0.5% Marcaine plain, 0.5 cc of 1% lidocaine plain, 0.5 cc of Kenalog 10.  Band-Aid applied  RICE therapy discussed with patient.  May take over-the-counter ibuprofen.  Compressive anklet dispensed.  Advised patient to use the cam boot if she notices any flareups or worsening of her pain.  Follow-up in 2 to 3 weeks.  ***  Quince Santana L. Marchia Bond, AACFAS [facility]     [address] [place], [place] [address]                Office [phone]  Fax [phone]

## 2023-08-19 DIAGNOSIS — I493 Ventricular premature depolarization: Secondary | ICD-10-CM | POA: Diagnosis not present

## 2023-08-27 ENCOUNTER — Ambulatory Visit (INDEPENDENT_AMBULATORY_CARE_PROVIDER_SITE_OTHER): Admitting: Podiatry

## 2023-08-27 ENCOUNTER — Encounter: Payer: Self-pay | Admitting: Podiatry

## 2023-08-27 DIAGNOSIS — M25572 Pain in left ankle and joints of left foot: Secondary | ICD-10-CM

## 2023-08-27 DIAGNOSIS — M19072 Primary osteoarthritis, left ankle and foot: Secondary | ICD-10-CM | POA: Diagnosis not present

## 2023-08-27 MED ORDER — TRIAMCINOLONE ACETONIDE 10 MG/ML IJ SUSP
5.0000 mg | Freq: Once | INTRAMUSCULAR | Status: AC
  Administered 2023-08-27: 5 mg via INTRAMUSCULAR

## 2023-08-27 NOTE — Progress Notes (Unsigned)
 Chief Complaint  Patient presents with   Foot Pain    Left midfoot pain follow up. The injection last time helped the area that was hurting, however, the pain has shifted to the lateral side and the arch still feels like it is popping in shoes. Not diabetic and takes ASA 81    HPI: 63 y.o. female presents today following up after injection to left dorsal midfoot.  She states that this is doing very well at this point.  She does endorse more pain which she locates at the sinus tarsi region left foot.  Described as radiating into the arch and along the dorsal lateral hindfoot and midfoot.  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   Cervicalgia    Chest pain 04/21/2019   Cholecystitis, chronic 03/02/2016   Complication of anesthesia    hard to wake up   Conductive hearing loss in right ear 04/05/2015   Coronary artery calcification seen on CT scan 04/26/2015   Overview:  Normal stress MPS at 7.8 mets Sept 2016 EF 60%   Diarrhea in adult patient 01/25/2015   Erosive gastritis 03/02/2016   Headache(784.0)    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 Pt is aware and Radiology has issued a supplement report indicating no changes.  Overview:  Overview:  Chronic scarring left upper lobe does not represent a malignant process and does NOT NEED  further imaging as  has not changed since the year 2000. Pt is aware and Radiology has issued a supplement report indicating no changes.  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 represents the nodule is described on July 2014 CT scan of chest After reviewing prior x-ray studies going back to the year 2000 this area of scarring has not changed in a 14 year interval of time  Plan No additional imaging studies are necessary Pulmonary followup is as needed I will have radiology compare the most recent CT scan of chest with CT scan of neck which shows the upper chest area from 2009   Mild persistent asthma without complication 01/25/2015   Moderate persistent asthma 04/07/2013   Overweight (BMI 25.0-29.9) 12/11/2017   Pain in limb    Postmenopausal atrophic vaginitis    Premature atrial contraction 02/23/2015   Pure hypercholesterolemia    Unspecified sinusitis (chronic)    Ventricular premature beats 02/23/2015   Overview:  With hypokalemia    Past Surgical History:  Procedure Laterality Date   CHOLECYSTECTOMY     HARDWARE REMOVAL  05/04/2011   Procedure: HARDWARE REMOVAL;  Surgeon: Wyn Forster., MD;  Location: [facility];  Service: Orthopedics;  Laterality: Left;  DVR plate removal left wrist,   INNER EAR SURGERY     Perforated eardrum   SHOULDER ARTHROSCOPY  1997   lt   TUBAL LIGATION     WRIST ARTHROPLASTY  9/12   lt -[facility]    Allergies  Allergen Reactions   Iodine Hives    IV, AND BETADINE   Metrizamide Hives   Prednisone Swelling    LOWER EXTREMITY   Contrast Media [Iodinated Contrast Media] Hives   Doxycycline Other (See Comments)    dizzy   Iohexol Hives    ROS    Physical Exam: There were no vitals filed for this visit.  General: The patient is alert and oriented x3 in no acute distress.  Dermatology: Skin is warm, dry and supple bilateral lower extremities. Interspaces are clear of maceration and debris.    Vascular:  Palpable pedal pulses bilaterally. Capillary refill within normal limits.  No diffuse appreciable edema.  No erythema or calor.  Telangiectasias present  Neurological: Light touch sensation grossly intact bilateral feet.   Musculoskeletal Exam: No tenderness on palpation of dorsal left midfoot today.  Positive tenderness on palpation of left sinus tarsi, extensor digitorum brevis and extensor hallucis brevis muscle bellies along floor of sinus tarsi.  Radiographic Exam: Left foot 08/06/2023 Normal osseous mineralization. Joint spaces preserved.  No fractures noted.  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 Arthritis of left midfoot      Meds ordered this encounter  Medications   triamcinolone acetonide (KENALOG) 10 MG/ML injection 5 mg   None  Discussed clinical findings with patient today.  Radiographs reviewed with patient Discussed findings of sinus tarsitis left foot.  Verbal consent obtained to administer corticosteroid injection to left subtalar joint via sinus tarsi.  Alcohol skin prep due to Betadine allergy.  Directed with 0.5 cc of 0.5% Marcaine plain and 0.5 cc of Kenalog 10.  Band-Aid applied  RICE therapy discussed with patient.  Lace up ankle brace dispensed to the patient today.  She has had difficulty tolerating the boot for extended period of time.  Advised her to use the boot and the brace to allow rest of the subtalar joint.  Importance of good supportive shoe gear discussed as well.   Tery Hoeger L. Marchia Bond, AACFAS [facility]     [address] [place], [place] [address]                Office [phone]  Fax [phone]

## 2023-08-28 ENCOUNTER — Encounter: Payer: Self-pay | Admitting: Cardiology

## 2023-08-28 ENCOUNTER — Ambulatory Visit: Payer: BC Managed Care – PPO | Attending: Cardiology | Admitting: Cardiology

## 2023-08-28 VITALS — BP 134/84 | HR 76 | Ht 62.0 in | Wt 164.4 lb

## 2023-08-28 DIAGNOSIS — R002 Palpitations: Secondary | ICD-10-CM

## 2023-08-28 DIAGNOSIS — I493 Ventricular premature depolarization: Secondary | ICD-10-CM

## 2023-08-28 DIAGNOSIS — R931 Abnormal findings on diagnostic imaging of heart and coronary circulation: Secondary | ICD-10-CM | POA: Diagnosis not present

## 2023-08-28 DIAGNOSIS — I471 Supraventricular tachycardia, unspecified: Secondary | ICD-10-CM

## 2023-08-28 MED ORDER — METOPROLOL SUCCINATE ER 50 MG PO TB24: 50.0000 mg | ORAL_TABLET | Freq: Two times a day (BID) | ORAL | 6 refills | Status: AC

## 2023-08-28 NOTE — Progress Notes (Signed)
  Electrophysiology Office Note:   Date:  08/28/2023  ID:  Jessica Francis, DOB 1960/11/18, MRN [id number]  Primary Cardiologist: Norman Herrlich, MD Primary Heart Failure: None Electrophysiologist: Erendira Crabtree Jorja Loa, MD      History of Present Illness:   Jessica Francis is a 63 y.o. female with h/o coronary artery disease, hyperlipidemia, SVT seen today for  for Electrophysiology evaluation of SVT at the request of Norman Herrlich.    She has had SVT over the last few years.  Over the last few months, her SVT is gotten worse.  She feels palpitations and some mild shortness of breath.  There are no exacerbating or alleviating factors for her SVT.  Fortunately episodes are quite short.  She has not needed to take rescue medications for her episodes.  When she is in sinus rhythm, she feels well without complaint.  Today, denies symptoms of palpitations, chest pain, shortness of breath, orthopnea, PND, lower extremity edema, claudication, dizziness, presyncope, syncope, bleeding, or neurologic sequela. The patient is tolerating medications without difficulties.    Review of systems complete and found to be negative unless listed in HPI.   EP Information / Studies Reviewed:    EKG is ordered today. Personal review as below.  EKG Interpretation Date/Time:  Tuesday August 28 2023 15:37:54 EDT Ventricular Rate:  76 PR Interval:  140 QRS Duration:  86 QT Interval:  354 QTC Calculation: 398 R Axis:   50  Text Interpretation: Normal sinus rhythm Cannot rule out Anterior infarct (cited on or before 22-Jun-2023) When compared with ECG of 22-Jun-2023 22:35, No significant change was found Confirmed by Lindy Pennisi ([id number]) on 08/28/2023 3:54:51 PM     Risk Assessment/Calculations:              Physical Exam:   VS:  BP 134/84 (BP Location: Left Arm, Patient Position: Sitting, Cuff Size: Normal)   Pulse 76   Ht 5\' 2"  (1.575 m)   Wt 164 lb 6.4 oz (74.6 kg)   SpO2 98%   BMI 30.07 kg/m    Wt Readings  from Last 3 Encounters:  08/28/23 164 lb 6.4 oz (74.6 kg)  06/22/23 165 lb (74.8 kg)  05/08/23 162 lb (73.5 kg)     GEN: Well nourished, well developed in no acute distress NECK: No JVD; No carotid bruits CARDIAC: Regular rate and rhythm, no murmurs, rubs, gallops RESPIRATORY:  Clear to auscultation without rales, wheezing or rhonchi  ABDOMEN: Soft, non-tender, non-distended EXTREMITIES:  No edema; No deformity   ASSESSMENT AND PLAN:    1.  SVT: Patient is having multiple episodes.  She wore a cardiac monitor and it triggered symptomatic episodes for both SVT and sinus rhythm.  Fortunately her SVT is not rapid and all quite short-lived.  She is currently on metoprolol and noticed that improvement with this medication.  She feels that potentially she is having more episodes as she is nearing her next dose.  Karene Bracken switch to Toprol-XL 50 mg twice daily.  This medication can be titrated up to effect by her primary cardiologist.  If she has further issues with SVT, would be happy to see her back.  2.  Coronary artery disease: No current chest pain.  Nonobstructive on CTA.  3.  PVCs: Fortunately low burden at less than 1% on cardiac monitor.  Continue current management  Follow up with Dr. Elberta Fortis  as needed   Signed, Korinne Greenstein Jorja Loa, MD

## 2023-08-28 NOTE — Patient Instructions (Addendum)
 Medication Instructions:  Your physician has recommended you make the following change in your medication:  STOP Metoprolol Tartrate (Lopressor) START Metoprolol Succinate (Toprol) 50 mg twice daily  *If you need a refill on your cardiac medications before your next appointment, please call your pharmacy*   Lab Work: None ordered   Testing/Procedures: None ordered   Follow-Up: At [facility], you and your health needs are our priority.  As part of our continuing mission to provide you with exceptional heart care, we have created designated Provider Care Teams.  These Care Teams include your primary Cardiologist (physician) and Advanced Practice Providers (APPs -  Physician Assistants and Nurse Practitioners) who all work together to provide you with the care you need, when you need it.  Your next appointment:   as  needed  The format for your next appointment:   In Person  Provider:   Loman Brooklyn, MD   Follow up with Dr. Dulce Sellar  Thank you for choosing [facility]!!   Dory Horn, RN [phone]

## 2023-09-10 ENCOUNTER — Encounter: Payer: Self-pay | Admitting: Podiatry

## 2023-09-10 ENCOUNTER — Ambulatory Visit (INDEPENDENT_AMBULATORY_CARE_PROVIDER_SITE_OTHER): Admitting: Podiatry

## 2023-09-10 DIAGNOSIS — M19072 Primary osteoarthritis, left ankle and foot: Secondary | ICD-10-CM

## 2023-09-10 DIAGNOSIS — M25572 Pain in left ankle and joints of left foot: Secondary | ICD-10-CM

## 2023-09-10 NOTE — Progress Notes (Signed)
 Chief Complaint  Patient presents with   Foot Pain    Sinus tarsi check for left foot. She went back into the waking boot for more support, she felt like the brace was not supportive. She does say it is some better. Not diabetic and no anti coag.     HPI: 63 y.o. female presents today following up left sinus tarsi pain. Doing well today. She says it is doing quite a bit better. Has mostly been using the CAM boot.  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   Cervicalgia    Chest pain 04/21/2019   Cholecystitis, chronic 03/02/2016   Complication of anesthesia    hard to wake up   Conductive hearing loss in right ear 04/05/2015   Coronary artery calcification seen on CT scan 04/26/2015   Overview:  Normal stress MPS at 7.8 mets Sept 2016 EF 60%   Diarrhea in adult patient 01/25/2015   Erosive gastritis 03/02/2016   Headache(784.0)    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 Pt is aware and Radiology has issued a supplement report indicating no changes.  Overview:  Overview:  Chronic scarring left upper lobe does not represent a malignant process and does NOT NEED  further imaging as has not changed since the year 2000. Pt is aware and Radiology has issued a supplement report indicating no changes.  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 represents the nodule is described on July 2014 CT scan of chest After reviewing prior x-ray studies going back to the year 2000 this area of scarring has not changed in a 14 year interval of time  Plan No additional imaging studies are necessary Pulmonary followup is as needed I will have radiology compare the most recent CT scan of chest with CT scan of neck which shows the upper chest area from 2009   Mild persistent asthma without complication 01/25/2015   Moderate persistent asthma 04/07/2013   Overweight (BMI 25.0-29.9) 12/11/2017   Pain in limb    Postmenopausal atrophic vaginitis    Premature atrial contraction 02/23/2015   Pure hypercholesterolemia    Unspecified sinusitis (chronic)    Ventricular premature beats 02/23/2015   Overview:  With hypokalemia    Past Surgical History:  Procedure Laterality Date   CHOLECYSTECTOMY     HARDWARE REMOVAL  05/04/2011   Procedure: HARDWARE REMOVAL;  Surgeon: Amelie Baize., MD;  Location: [facility];  Service: Orthopedics;  Laterality: Left;  DVR plate removal left wrist,   INNER EAR SURGERY     Perforated eardrum   SHOULDER ARTHROSCOPY  1997   lt   TUBAL LIGATION     WRIST ARTHROPLASTY  9/12   lt -[facility]    Allergies  Allergen Reactions  Iodine Hives    IV, AND BETADINE   Metrizamide Hives   Prednisone  Swelling    LOWER EXTREMITY   Contrast Media [Iodinated Contrast Media] Hives   Doxycycline Other (See Comments)    dizzy   Iohexol  Hives    ROS    Physical Exam: There were no vitals filed for this visit.  General: The patient is alert and oriented x3 in no acute distress.  Dermatology: Skin is warm, dry and supple bilateral lower extremities. Interspaces are clear of maceration and debris.    Vascular: Palpable pedal pulses bilaterally. Capillary refill within normal limits.  No diffuse appreciable edema.  No erythema or calor.  Telangiectasias present  Neurological: Light touch  sensation grossly intact bilateral feet.   Musculoskeletal Exam: No tenderness on palpation of dorsal left midfoot today.  No tenderness on palpation of left sinus tarsi. Mild tenderness extensor digitorum brevis and extensor hallucis brevis muscle bellies left foot.  Radiographic Exam: Left foot 08/06/2023 Normal osseous mineralization. Joint spaces preserved.  No fractures noted.  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 Arthritis of left midfoot      No orders of the defined types were placed in this encounter.  None  Discussed clinical findings with patient today.  Doing pretty well today.  Her symptoms have improved.  She can slowly transition out of the boot back into regular supportive shoes.  Discussed RICE therapy and OTC NSAIDs as needed.  Follow-up as needed if symptoms recur or worsen  Kaidance Pantoja L. Lunda Salines, AACFAS [facility]     [address] [place], [place] [address]                Office [phone]  Fax [phone]

## 2023-11-14 ENCOUNTER — Other Ambulatory Visit: Payer: Self-pay | Admitting: Student

## 2023-11-14 DIAGNOSIS — R200 Anesthesia of skin: Secondary | ICD-10-CM

## 2023-11-14 DIAGNOSIS — R519 Headache, unspecified: Secondary | ICD-10-CM

## 2023-11-14 DIAGNOSIS — H9311 Tinnitus, right ear: Secondary | ICD-10-CM

## 2023-11-14 DIAGNOSIS — R4189 Other symptoms and signs involving cognitive functions and awareness: Secondary | ICD-10-CM

## 2023-11-14 DIAGNOSIS — Z8669 Personal history of other diseases of the nervous system and sense organs: Secondary | ICD-10-CM

## 2023-11-20 ENCOUNTER — Encounter: Payer: Self-pay | Admitting: Student

## 2023-11-28 ENCOUNTER — Ambulatory Visit
Admission: RE | Admit: 2023-11-28 | Discharge: 2023-11-28 | Disposition: A | Discharge status: Home / Self Care | Source: Ambulatory Visit | Attending: Student | Admitting: Student

## 2023-11-28 ENCOUNTER — Ambulatory Visit
Admission: RE | Admit: 2023-11-28 | Discharge: 2023-11-28 | Disposition: A | Discharge status: Home / Self Care | Source: Ambulatory Visit | Attending: Student

## 2023-11-28 DIAGNOSIS — R519 Headache, unspecified: Secondary | ICD-10-CM

## 2023-11-28 DIAGNOSIS — Z8669 Personal history of other diseases of the nervous system and sense organs: Secondary | ICD-10-CM

## 2023-11-28 DIAGNOSIS — H9311 Tinnitus, right ear: Secondary | ICD-10-CM

## 2023-11-28 DIAGNOSIS — R4189 Other symptoms and signs involving cognitive functions and awareness: Secondary | ICD-10-CM

## 2023-11-28 DIAGNOSIS — R200 Anesthesia of skin: Secondary | ICD-10-CM

## 2023-11-28 MED ORDER — GADOPICLENOL 0.5 MMOL/ML IV SOLN
7.0000 mL | Freq: Once | INTRAVENOUS | Status: AC | PRN
  Administered 2023-11-28: 7 mL via INTRAVENOUS

## 2023-12-03 ENCOUNTER — Other Ambulatory Visit: Payer: Self-pay | Admitting: Student

## 2023-12-03 ENCOUNTER — Ambulatory Visit: Attending: Cardiology

## 2023-12-03 DIAGNOSIS — H9311 Tinnitus, right ear: Secondary | ICD-10-CM
